# Patient Record
Sex: Male | Born: 1960 | Race: White | Hispanic: No | Marital: Married | Smoking: Current some day smoker
Health system: Southern US, Community
[De-identification: ages and names within clinical notes are randomized; demographics above are authoritative.]

## PROBLEM LIST (undated history)

## (undated) DIAGNOSIS — R112 Nausea with vomiting, unspecified: Secondary | ICD-10-CM

## (undated) DIAGNOSIS — I10 Essential (primary) hypertension: Secondary | ICD-10-CM

## (undated) DIAGNOSIS — Z889 Allergy status to unspecified drugs, medicaments and biological substances status: Secondary | ICD-10-CM

## (undated) DIAGNOSIS — Z9889 Other specified postprocedural states: Secondary | ICD-10-CM

## (undated) DIAGNOSIS — I82409 Acute embolism and thrombosis of unspecified deep veins of unspecified lower extremity: Secondary | ICD-10-CM

## (undated) DIAGNOSIS — G4730 Sleep apnea, unspecified: Secondary | ICD-10-CM

## (undated) DIAGNOSIS — E669 Obesity, unspecified: Secondary | ICD-10-CM

## (undated) DIAGNOSIS — K219 Gastro-esophageal reflux disease without esophagitis: Secondary | ICD-10-CM

## (undated) DIAGNOSIS — E119 Type 2 diabetes mellitus without complications: Secondary | ICD-10-CM

## (undated) DIAGNOSIS — J40 Bronchitis, not specified as acute or chronic: Secondary | ICD-10-CM

## (undated) DIAGNOSIS — M1990 Unspecified osteoarthritis, unspecified site: Secondary | ICD-10-CM

## (undated) DIAGNOSIS — R7303 Prediabetes: Secondary | ICD-10-CM

## (undated) HISTORY — PX: COLONOSCOPY W/ BIOPSIES AND POLYPECTOMY: SHX1376

## (undated) HISTORY — PX: FRACTURE SURGERY: SHX138

## (undated) HISTORY — PX: MULTIPLE TOOTH EXTRACTIONS: SHX2053

---

## 2005-10-23 ENCOUNTER — Ambulatory Visit: Payer: Self-pay | Admitting: Family Medicine

## 2006-08-30 ENCOUNTER — Other Ambulatory Visit: Payer: Self-pay

## 2006-08-30 ENCOUNTER — Emergency Department: Payer: Self-pay | Admitting: Emergency Medicine

## 2006-10-16 ENCOUNTER — Ambulatory Visit: Payer: Self-pay | Admitting: Gastroenterology

## 2006-10-22 ENCOUNTER — Ambulatory Visit: Payer: Self-pay | Admitting: Gastroenterology

## 2009-08-29 ENCOUNTER — Ambulatory Visit: Payer: Self-pay | Admitting: Unknown Physician Specialty

## 2009-09-10 ENCOUNTER — Encounter: Payer: Self-pay | Admitting: Physician Assistant

## 2009-09-28 ENCOUNTER — Encounter: Payer: Self-pay | Admitting: Physician Assistant

## 2010-04-03 ENCOUNTER — Emergency Department: Payer: Self-pay | Admitting: Emergency Medicine

## 2010-07-10 ENCOUNTER — Ambulatory Visit: Payer: Self-pay | Admitting: Otolaryngology

## 2010-07-22 ENCOUNTER — Ambulatory Visit: Payer: Self-pay | Admitting: Family Medicine

## 2010-08-03 ENCOUNTER — Other Ambulatory Visit: Payer: Self-pay | Admitting: Family Medicine

## 2011-04-22 ENCOUNTER — Ambulatory Visit: Payer: Self-pay | Admitting: Family Medicine

## 2011-06-25 ENCOUNTER — Ambulatory Visit: Payer: Self-pay | Admitting: Unknown Physician Specialty

## 2011-11-11 ENCOUNTER — Ambulatory Visit: Payer: Self-pay | Admitting: Oncology

## 2011-11-21 ENCOUNTER — Ambulatory Visit: Payer: Self-pay | Admitting: Oncology

## 2011-11-29 ENCOUNTER — Ambulatory Visit: Payer: Self-pay | Admitting: Oncology

## 2012-06-30 HISTORY — PX: BACK SURGERY: SHX140

## 2013-10-03 DIAGNOSIS — M1710 Unilateral primary osteoarthritis, unspecified knee: Secondary | ICD-10-CM | POA: Insufficient documentation

## 2013-10-03 DIAGNOSIS — I10 Essential (primary) hypertension: Secondary | ICD-10-CM | POA: Insufficient documentation

## 2013-10-03 DIAGNOSIS — Z87898 Personal history of other specified conditions: Secondary | ICD-10-CM | POA: Insufficient documentation

## 2013-10-03 DIAGNOSIS — IMO0002 Reserved for concepts with insufficient information to code with codable children: Secondary | ICD-10-CM | POA: Insufficient documentation

## 2013-10-03 DIAGNOSIS — I82409 Acute embolism and thrombosis of unspecified deep veins of unspecified lower extremity: Secondary | ICD-10-CM | POA: Insufficient documentation

## 2013-10-03 DIAGNOSIS — J309 Allergic rhinitis, unspecified: Secondary | ICD-10-CM | POA: Insufficient documentation

## 2013-10-07 ENCOUNTER — Emergency Department: Payer: Self-pay | Admitting: Emergency Medicine

## 2013-10-07 LAB — CBC WITH DIFFERENTIAL/PLATELET
BASOS ABS: 0 10*3/uL (ref 0.0–0.1)
BASOS PCT: 0.4 %
Eosinophil #: 0.1 10*3/uL (ref 0.0–0.7)
Eosinophil %: 0.7 %
HCT: 40.7 % (ref 40.0–52.0)
HGB: 13.3 g/dL (ref 13.0–18.0)
Lymphocyte #: 0.8 10*3/uL — ABNORMAL LOW (ref 1.0–3.6)
Lymphocyte %: 8.1 %
MCH: 28.9 pg (ref 26.0–34.0)
MCHC: 32.7 g/dL (ref 32.0–36.0)
MCV: 88 fL (ref 80–100)
MONO ABS: 0.6 x10 3/mm (ref 0.2–1.0)
MONOS PCT: 6.4 %
NEUTROS ABS: 8 10*3/uL — AB (ref 1.4–6.5)
Neutrophil %: 84.4 %
Platelet: 240 10*3/uL (ref 150–440)
RBC: 4.61 10*6/uL (ref 4.40–5.90)
RDW: 13.7 % (ref 11.5–14.5)
WBC: 9.5 10*3/uL (ref 3.8–10.6)

## 2013-10-07 LAB — COMPREHENSIVE METABOLIC PANEL
ANION GAP: 6 — AB (ref 7–16)
Albumin: 3.4 g/dL (ref 3.4–5.0)
Alkaline Phosphatase: 103 U/L
BILIRUBIN TOTAL: 0.6 mg/dL (ref 0.2–1.0)
BUN: 18 mg/dL (ref 7–18)
CHLORIDE: 101 mmol/L (ref 98–107)
Calcium, Total: 8.8 mg/dL (ref 8.5–10.1)
Co2: 27 mmol/L (ref 21–32)
Creatinine: 1.05 mg/dL (ref 0.60–1.30)
EGFR (African American): >60
EGFR (Non-African Amer.): >60
Glucose: 138 mg/dL — ABNORMAL HIGH (ref 65–99)
Osmolality: 272 (ref 275–301)
POTASSIUM: 3.9 mmol/L (ref 3.5–5.1)
SGOT(AST): 26 U/L (ref 15–37)
SGPT (ALT): 32 U/L (ref 12–78)
Sodium: 134 mmol/L — ABNORMAL LOW (ref 136–145)
TOTAL PROTEIN: 7.5 g/dL (ref 6.4–8.2)

## 2013-10-07 LAB — PROTIME-INR
INR: 1.4
Prothrombin Time: 16.7 secs — ABNORMAL HIGH (ref 11.5–14.7)

## 2014-05-19 DIAGNOSIS — E119 Type 2 diabetes mellitus without complications: Secondary | ICD-10-CM | POA: Insufficient documentation

## 2014-05-19 DIAGNOSIS — E781 Pure hyperglyceridemia: Secondary | ICD-10-CM | POA: Insufficient documentation

## 2014-06-13 ENCOUNTER — Ambulatory Visit: Payer: Self-pay | Admitting: Gastroenterology

## 2014-07-04 ENCOUNTER — Ambulatory Visit: Payer: Self-pay | Admitting: Gastroenterology

## 2014-07-05 ENCOUNTER — Emergency Department: Payer: Self-pay | Admitting: Emergency Medicine

## 2014-07-05 LAB — CBC
HCT: 46 % (ref 40.0–52.0)
HGB: 15.4 g/dL (ref 13.0–18.0)
MCH: 30.6 pg (ref 26.0–34.0)
MCHC: 33.6 g/dL (ref 32.0–36.0)
MCV: 91 fL (ref 80–100)
Platelet: 184 10*3/uL (ref 150–440)
RBC: 5.04 10*6/uL (ref 4.40–5.90)
RDW: 13.4 % (ref 11.5–14.5)
WBC: 9.3 10*3/uL (ref 3.8–10.6)

## 2014-07-05 LAB — COMPREHENSIVE METABOLIC PANEL
ALBUMIN: 4 g/dL (ref 3.4–5.0)
ALT: 58 U/L
AST: 44 U/L — AB (ref 15–37)
Alkaline Phosphatase: 90 U/L
Anion Gap: 10 (ref 7–16)
BUN: 16 mg/dL (ref 7–18)
Bilirubin,Total: 0.4 mg/dL (ref 0.2–1.0)
Calcium, Total: 9.3 mg/dL (ref 8.5–10.1)
Chloride: 103 mmol/L (ref 98–107)
Co2: 25 mmol/L (ref 21–32)
Creatinine: 1.1 mg/dL (ref 0.60–1.30)
EGFR (African American): >60
EGFR (Non-African Amer.): >60
GLUCOSE: 191 mg/dL — AB (ref 65–99)
Osmolality: 282 (ref 275–301)
Potassium: 3.4 mmol/L — ABNORMAL LOW (ref 3.5–5.1)
Sodium: 138 mmol/L (ref 136–145)
TOTAL PROTEIN: 8.3 g/dL — AB (ref 6.4–8.2)

## 2014-07-05 LAB — URINALYSIS, COMPLETE
Bacteria: NONE SEEN
Bilirubin,UR: NEGATIVE
Blood: NEGATIVE
Glucose,UR: 50 mg/dL (ref 0–75)
Hyaline Cast: 3
Leukocyte Esterase: NEGATIVE
Nitrite: NEGATIVE
PH: 5 (ref 4.5–8.0)
RBC,UR: <1 /HPF (ref 0–5)
SPECIFIC GRAVITY: 1.025 (ref 1.003–1.030)
Squamous Epithelial: NONE SEEN
WBC UR: 1 /HPF (ref 0–5)

## 2014-07-05 LAB — TROPONIN I: Troponin-I: <0.02 ng/mL

## 2014-07-05 LAB — LIPASE, BLOOD: Lipase: 192 U/L (ref 73–393)

## 2014-10-23 LAB — SURGICAL PATHOLOGY

## 2016-05-20 ENCOUNTER — Other Ambulatory Visit: Payer: Self-pay | Admitting: Orthopedic Surgery

## 2016-05-30 ENCOUNTER — Encounter (HOSPITAL_COMMUNITY)
Admission: RE | Admit: 2016-05-30 | Discharge: 2016-05-30 | Disposition: A | Discharge status: Home / Self Care | Payer: Managed Care, Other (non HMO) | Source: Ambulatory Visit | Attending: Orthopedic Surgery | Admitting: Orthopedic Surgery

## 2016-05-30 ENCOUNTER — Encounter (HOSPITAL_COMMUNITY): Payer: Self-pay

## 2016-05-30 DIAGNOSIS — Z01810 Encounter for preprocedural cardiovascular examination: Secondary | ICD-10-CM | POA: Diagnosis present

## 2016-05-30 DIAGNOSIS — K219 Gastro-esophageal reflux disease without esophagitis: Secondary | ICD-10-CM | POA: Diagnosis not present

## 2016-05-30 DIAGNOSIS — Z01812 Encounter for preprocedural laboratory examination: Secondary | ICD-10-CM | POA: Diagnosis not present

## 2016-05-30 DIAGNOSIS — E119 Type 2 diabetes mellitus without complications: Secondary | ICD-10-CM | POA: Diagnosis not present

## 2016-05-30 DIAGNOSIS — R001 Bradycardia, unspecified: Secondary | ICD-10-CM | POA: Diagnosis not present

## 2016-05-30 DIAGNOSIS — I10 Essential (primary) hypertension: Secondary | ICD-10-CM | POA: Diagnosis not present

## 2016-05-30 DIAGNOSIS — R9431 Abnormal electrocardiogram [ECG] [EKG]: Secondary | ICD-10-CM | POA: Insufficient documentation

## 2016-05-30 DIAGNOSIS — M1990 Unspecified osteoarthritis, unspecified site: Secondary | ICD-10-CM | POA: Insufficient documentation

## 2016-05-30 DIAGNOSIS — G4730 Sleep apnea, unspecified: Secondary | ICD-10-CM | POA: Diagnosis not present

## 2016-05-30 DIAGNOSIS — E669 Obesity, unspecified: Secondary | ICD-10-CM | POA: Insufficient documentation

## 2016-05-30 DIAGNOSIS — Z86718 Personal history of other venous thrombosis and embolism: Secondary | ICD-10-CM | POA: Diagnosis not present

## 2016-05-30 HISTORY — DX: Gastro-esophageal reflux disease without esophagitis: K21.9

## 2016-05-30 HISTORY — DX: Unspecified osteoarthritis, unspecified site: M19.90

## 2016-05-30 HISTORY — DX: Other specified postprocedural states: Z98.890

## 2016-05-30 HISTORY — DX: Acute embolism and thrombosis of unspecified deep veins of unspecified lower extremity: I82.409

## 2016-05-30 HISTORY — DX: Allergy status to unspecified drugs, medicaments and biological substances: Z88.9

## 2016-05-30 HISTORY — DX: Bronchitis, not specified as acute or chronic: J40

## 2016-05-30 HISTORY — DX: Sleep apnea, unspecified: G47.30

## 2016-05-30 HISTORY — DX: Obesity, unspecified: E66.9

## 2016-05-30 HISTORY — DX: Prediabetes: R73.03

## 2016-05-30 HISTORY — DX: Essential (primary) hypertension: I10

## 2016-05-30 HISTORY — DX: Nausea with vomiting, unspecified: R11.2

## 2016-05-30 LAB — CBC WITH DIFFERENTIAL/PLATELET
BASOS PCT: 0 %
Basophils Absolute: 0 10*3/uL (ref 0.0–0.1)
Eosinophils Absolute: 0.1 10*3/uL (ref 0.0–0.7)
Eosinophils Relative: 2 %
HEMATOCRIT: 45.2 % (ref 39.0–52.0)
Hemoglobin: 15.5 g/dL (ref 13.0–17.0)
LYMPHS ABS: 1.9 10*3/uL (ref 0.7–4.0)
Lymphocytes Relative: 29 %
MCH: 30.9 pg (ref 26.0–34.0)
MCHC: 34.3 g/dL (ref 30.0–36.0)
MCV: 90 fL (ref 78.0–100.0)
MONO ABS: 0.4 10*3/uL (ref 0.1–1.0)
MONOS PCT: 6 %
NEUTROS ABS: 4.2 10*3/uL (ref 1.7–7.7)
Neutrophils Relative %: 63 %
Platelets: 158 10*3/uL (ref 150–400)
RBC: 5.02 MIL/uL (ref 4.22–5.81)
RDW: 13.4 % (ref 11.5–15.5)
WBC: 6.6 10*3/uL (ref 4.0–10.5)

## 2016-05-30 LAB — URINALYSIS, ROUTINE W REFLEX MICROSCOPIC
BILIRUBIN URINE: NEGATIVE
GLUCOSE, UA: NEGATIVE mg/dL
HGB URINE DIPSTICK: NEGATIVE
KETONES UR: NEGATIVE mg/dL
Leukocytes, UA: NEGATIVE
Nitrite: NEGATIVE
PH: 6.5 (ref 5.0–8.0)
Protein, ur: NEGATIVE mg/dL
SPECIFIC GRAVITY, URINE: 1.019 (ref 1.005–1.030)

## 2016-05-30 LAB — BASIC METABOLIC PANEL
ANION GAP: 10 (ref 5–15)
BUN: 20 mg/dL (ref 6–20)
CALCIUM: 9.8 mg/dL (ref 8.9–10.3)
CO2: 24 mmol/L (ref 22–32)
CREATININE: 0.97 mg/dL (ref 0.61–1.24)
Chloride: 106 mmol/L (ref 101–111)
GFR calc Af Amer: >60 mL/min (ref >60)
GFR calc non Af Amer: >60 mL/min (ref >60)
GLUCOSE: 91 mg/dL (ref 65–99)
Potassium: 4 mmol/L (ref 3.5–5.1)
Sodium: 140 mmol/L (ref 135–145)

## 2016-05-30 LAB — TYPE AND SCREEN
ABO/RH(D): B POS
ANTIBODY SCREEN: NEGATIVE

## 2016-05-30 LAB — SURGICAL PCR SCREEN
MRSA, PCR: NEGATIVE
STAPHYLOCOCCUS AUREUS: NEGATIVE

## 2016-05-30 LAB — PROTIME-INR
INR: 0.96
Prothrombin Time: 12.8 seconds (ref 11.4–15.2)

## 2016-05-30 LAB — APTT: aPTT: 31 seconds (ref 24–36)

## 2016-05-30 LAB — GLUCOSE, CAPILLARY: Glucose-Capillary: 99 mg/dL (ref 65–99)

## 2016-05-30 LAB — ABO/RH: ABO/RH(D): B POS

## 2016-05-30 NOTE — Pre-Procedure Instructions (Signed)
AUTHOR SLAVEN  05/30/2016      [id number] [address], [place] AT [address] [place] [address] Phone: [phone] Fax: [phone]    Your procedure is scheduled on Monday, June 09, 2016  Report to [facility] Admitting at 8:15 A.M.  Call this number if you have problems the morning of surgery:  [phone]   Remember:  Do not eat food or drink liquids after midnight Sunday, June 08, 2016  Take these medicines the morning of surgery with A SIP OF WATER : amLODipine (NORVASC), fexofenadine (ALLEGRA), omeprazole (PRILOSEC)  Stop taking Aspirin, vitamins, fish oil, Megared Omega 3 Krill oil and herbal medications. Do not take any NSAIDs ie: Ibuprofen, Advil, Naproxen, BC and Goody Powder or any medication containing Aspirin such as nabumetone (RELAFEN); stop Monday, June 02, 2016    How to Manage Your Diabetes Before and After Surgery  Why is it important to control my blood sugar before and after surgery? . Improving blood sugar levels before and after surgery helps healing and can limit problems. . A way of improving blood sugar control is eating a healthy diet by: o  Eating less sugar and carbohydrates o  Increasing activity/exercise o  Talking with your doctor about reaching your blood sugar goals . High blood sugars (greater than 180 mg/dL) can raise your risk of infections and slow your recovery, so you will need to focus on controlling your diabetes during the weeks before surgery. . Make sure that the doctor who takes care of your diabetes knows about your planned surgery including the date and location.  How do I manage my blood sugar before surgery? . Check your blood sugar at least 4 times a day, starting 2 days before surgery, to make sure that the level is not too high or low. o Check your blood sugar the morning of your surgery when you wake up and every 2 hours  until you get to the Short Stay unit. . If your blood sugar is less than 70 mg/dL, you will need to treat for low blood sugar: o Do not take insulin. o Treat a low blood sugar (less than 70 mg/dL) with  cup of clear juice (cranberry or apple), 4 glucose tablets, OR glucose gel. o Recheck blood sugar in 15 minutes after treatment (to make sure it is greater than 70 mg/dL). If your blood sugar is not greater than 70 mg/dL on recheck, call [phone] for further instructions. . Report your blood sugar to the short stay nurse when you get to Short Stay.  . If you are admitted to the hospital after surgery: o Your blood sugar will be checked by the staff and you will probably be given insulin after surgery (instead of oral diabetes medicines) to make sure you have good blood sugar levels. o The goal for blood sugar control after surgery is 80-180 mg/dL.  WHAT DO I DO ABOUT MY DIABETES MEDICATION?  Marland Kitchen Do not take oral diabetes medicines (pills) the morning of surgery such as metFORMIN (GLUCOPHAGE-XR)   Patient Signature:  Date:   Nurse Signature:  Date:   Reviewed and Endorsed by [facility] Patient Education Committee, August 2015  Do not wear jewelry, make-up or nail polish.  Do not wear lotions, powders, or perfumes, or deoderant.  Do not shave 48 hours prior to surgery.  Men may shave face and neck.  Do not bring valuables to the hospital.  [facility] is not responsible for any belongings or valuables.  Contacts, dentures or bridgework may not be worn into surgery.  Leave your suitcase in the car.  After surgery it may be brought to your room.  For patients admitted to the hospital, discharge time will be determined by your treatment team.  Special instructions: Shower the night before surgery and the morning of surgery with CHG.  Please read over the following fact sheets that you were given. Pain Booklet, Coughing and Deep Breathing, Blood Transfusion Information, Total Joint  Packet, MRSA Information and Surgical Site Infection Prevention

## 2016-05-30 NOTE — Progress Notes (Signed)
Pt denies SOB, chest pain, and being under the care of a cardiologist. Pt stated that a stress test was performed > 10 years ago but denies having a cardiac cath and echo. Pt denies having any recent labs and last A1c was done in June 2017. Requested recent chest x ray results from [facility] in [place].

## 2016-05-31 LAB — HEMOGLOBIN A1C
Hgb A1c MFr Bld: 5.6 % (ref 4.8–5.6)
Mean Plasma Glucose: 114 mg/dL

## 2016-06-05 NOTE — H&P (Signed)
TOTAL HIP ADMISSION H&P  Patient is admitted for right total hip arthroplasty.  Subjective:  Chief Complaint: right hip pain  HPI: Connor Brewer, 55 y.o. male, has a history of pain and functional disability in the right hip(s) due to arthritis and patient has failed non-surgical conservative treatments for greater than 12 weeks to include NSAID's and/or analgesics, corticosteriod injections, flexibility and strengthening excercises, weight reduction as appropriate and activity modification.  Onset of symptoms was abrupt starting 1 years ago with rapidlly worsening course since that time.The patient noted no past surgery on the right hip(s).  Patient currently rates pain in the right hip at 10 out of 10 with activity. Patient has night pain, worsening of pain with activity and weight bearing, trendelenberg gait, pain that interfers with activities of daily living and pain with passive range of motion. Patient has evidence of joint space narrowing by imaging studies. This condition presents safety issues increasing the risk of falls.  There is no current active infection.  There are no active problems to display for this patient.  Past Medical History:  Diagnosis Date  . Bronchitis    " aalergy driven"  . DVT (deep venous thrombosis) (HCC)    LLE  . GERD (gastroesophageal reflux disease)   . H/O seasonal allergies   . Hypertension   . Obesity   . Osteoarthritis    right hip  . PONV (postoperative nausea and vomiting)   . Pre-diabetes   . Sleep apnea    wears CPAP    Past Surgical History:  Procedure Laterality Date  . BACK SURGERY     laser spine  . COLONOSCOPY W/ BIOPSIES AND POLYPECTOMY    . FRACTURE SURGERY     right arm, left knee, right knee  . MULTIPLE TOOTH EXTRACTIONS      No prescriptions prior to admission.   Allergies  Allergen Reactions  . Ivp Dye [Iodinated Diagnostic Agents] Anaphylaxis and Other (See Comments)    Reaction took place in 1983.    Social History   Substance Use Topics  . Smoking status: Current Some Day Smoker    Types: Cigars  . Smokeless tobacco: Never Used  . Alcohol use Yes     Comment: social    Family History  Problem Relation Age of Onset  . Lung cancer Father      Review of Systems  Constitutional: Negative.   HENT:       Sinus problems  Eyes: Negative.   Respiratory: Negative.   Cardiovascular: Negative.   Gastrointestinal: Negative.   Genitourinary: Negative.   Musculoskeletal: Positive for joint pain.  Skin: Negative.   Neurological: Negative.   Endo/Heme/Allergies:       Hx of blood clots and blood sugar problem  Psychiatric/Behavioral: Negative.     Objective:  Physical Exam  Constitutional: He is oriented to person, place, and time. He appears well-developed and well-nourished.  HENT:  Head: Normocephalic and atraumatic.  Eyes: Pupils are equal, round, and reactive to light.  Neck: Normal range of motion. Neck supple.  Cardiovascular: Intact distal pulses.   Respiratory: Effort normal.  Musculoskeletal: He exhibits tenderness.  he does have obvious discomfort with internal rotation of the right hip.  Minimal pain with straight leg raise.  He does have severely limited internal rotation to approximately 5 to maybe 10.  His calves are soft and nontender.  He is neurovascularly intact distally.  Neurological: He is alert and oriented to person, place, and time.  Skin: Skin  is warm and dry.  Psychiatric: He has a normal mood and affect. His behavior is normal. Judgment and thought content normal.    Vital signs in last 24 hours:    Labs:   There is no height or weight on file to calculate BMI.   Imaging Review Plain radiographs demonstrate  rapidly progressive arthritis of the right hip with obvious reduction of the joint space to near bone-on-bone.  Assessment/Plan:  End stage arthritis, right hip(s)  The patient history, physical examination, clinical judgement of the provider and  imaging studies are consistent with end stage degenerative joint disease of the right hip(s) and total hip arthroplasty is deemed medically necessary. The treatment options including medical management, injection therapy, arthroscopy and arthroplasty were discussed at length. The risks and benefits of total hip arthroplasty were presented and reviewed. The risks due to aseptic loosening, infection, stiffness, dislocation/subluxation,  thromboembolic complications and other imponderables were discussed.  The patient acknowledged the explanation, agreed to proceed with the plan and consent was signed. Patient is being admitted for inpatient treatment for surgery, pain control, PT, OT, prophylactic antibiotics, VTE prophylaxis, progressive ambulation and ADL's and discharge planning.The patient is planning to be discharged home with home health services

## 2016-06-06 DIAGNOSIS — M1611 Unilateral primary osteoarthritis, right hip: Secondary | ICD-10-CM | POA: Diagnosis present

## 2016-06-06 MED ORDER — DEXTROSE 5 % IV SOLN
3.0000 g | INTRAVENOUS | Status: AC
  Administered 2016-06-09: 3 g via INTRAVENOUS
  Filled 2016-06-06: qty 3000

## 2016-06-06 MED ORDER — DEXTROSE-NACL 5-0.45 % IV SOLN: INTRAVENOUS | Status: DC

## 2016-06-09 ENCOUNTER — Inpatient Hospital Stay (HOSPITAL_COMMUNITY): Payer: Managed Care, Other (non HMO) | Admitting: Certified Registered Nurse Anesthetist

## 2016-06-09 ENCOUNTER — Inpatient Hospital Stay (HOSPITAL_COMMUNITY): Payer: Managed Care, Other (non HMO)

## 2016-06-09 ENCOUNTER — Inpatient Hospital Stay (HOSPITAL_COMMUNITY)
Admission: RE | Admit: 2016-06-09 | Discharge: 2016-06-11 | DRG: 470 | Disposition: A | Discharge status: Home Health | Payer: Managed Care, Other (non HMO) | Source: Ambulatory Visit | Attending: Orthopedic Surgery | Admitting: Orthopedic Surgery

## 2016-06-09 ENCOUNTER — Inpatient Hospital Stay (HOSPITAL_COMMUNITY): Payer: Managed Care, Other (non HMO) | Admitting: Vascular Surgery

## 2016-06-09 ENCOUNTER — Encounter (HOSPITAL_COMMUNITY)
Admission: RE | Disposition: A | Discharge status: Home Health | Payer: Self-pay | Source: Ambulatory Visit | Attending: Orthopedic Surgery

## 2016-06-09 DIAGNOSIS — E119 Type 2 diabetes mellitus without complications: Secondary | ICD-10-CM | POA: Diagnosis present

## 2016-06-09 DIAGNOSIS — F17200 Nicotine dependence, unspecified, uncomplicated: Secondary | ICD-10-CM | POA: Diagnosis present

## 2016-06-09 DIAGNOSIS — Z419 Encounter for procedure for purposes other than remedying health state, unspecified: Secondary | ICD-10-CM

## 2016-06-09 DIAGNOSIS — G4730 Sleep apnea, unspecified: Secondary | ICD-10-CM | POA: Diagnosis present

## 2016-06-09 DIAGNOSIS — M25551 Pain in right hip: Secondary | ICD-10-CM | POA: Diagnosis present

## 2016-06-09 DIAGNOSIS — Z86718 Personal history of other venous thrombosis and embolism: Secondary | ICD-10-CM | POA: Diagnosis not present

## 2016-06-09 DIAGNOSIS — Z7984 Long term (current) use of oral hypoglycemic drugs: Secondary | ICD-10-CM | POA: Diagnosis not present

## 2016-06-09 DIAGNOSIS — Z91041 Radiographic dye allergy status: Secondary | ICD-10-CM | POA: Diagnosis not present

## 2016-06-09 DIAGNOSIS — M1611 Unilateral primary osteoarthritis, right hip: Principal | ICD-10-CM | POA: Diagnosis present

## 2016-06-09 DIAGNOSIS — I10 Essential (primary) hypertension: Secondary | ICD-10-CM | POA: Diagnosis present

## 2016-06-09 DIAGNOSIS — K219 Gastro-esophageal reflux disease without esophagitis: Secondary | ICD-10-CM | POA: Diagnosis present

## 2016-06-09 HISTORY — PX: TOTAL HIP ARTHROPLASTY: SHX124

## 2016-06-09 LAB — GLUCOSE, CAPILLARY
Glucose-Capillary: 94 mg/dL (ref 65–99)
Glucose-Capillary: 176 mg/dL — ABNORMAL HIGH (ref 65–99)

## 2016-06-09 SURGERY — ARTHROPLASTY, HIP, TOTAL, ANTERIOR APPROACH
Anesthesia: Spinal | Laterality: Right

## 2016-06-09 MED ORDER — METOCLOPRAMIDE HCL 5 MG/ML IJ SOLN: 5.0000 mg | Freq: Three times a day (TID) | INTRAMUSCULAR | Status: DC | PRN

## 2016-06-09 MED ORDER — MIDAZOLAM HCL 2 MG/2ML IJ SOLN
INTRAMUSCULAR | Status: AC
  Filled 2016-06-09: qty 2

## 2016-06-09 MED ORDER — ACETAMINOPHEN 325 MG PO TABS
650.0000 mg | ORAL_TABLET | Freq: Four times a day (QID) | ORAL | Status: DC | PRN
  Administered 2016-06-10 (×2): 650 mg via ORAL
  Filled 2016-06-09 (×2): qty 2

## 2016-06-09 MED ORDER — KCL IN DEXTROSE-NACL 20-5-0.45 MEQ/L-%-% IV SOLN
INTRAVENOUS | Status: DC
  Administered 2016-06-09 (×2): via INTRAVENOUS
  Filled 2016-06-09 (×3): qty 1000

## 2016-06-09 MED ORDER — METFORMIN HCL ER 500 MG PO TB24
750.0000 mg | ORAL_TABLET | Freq: Every day | ORAL | Status: DC
  Administered 2016-06-11: 750 mg via ORAL
  Filled 2016-06-09: qty 1.5

## 2016-06-09 MED ORDER — ASPIRIN EC 325 MG PO TBEC: 325.0000 mg | DELAYED_RELEASE_TABLET | Freq: Two times a day (BID) | ORAL | 0 refills | Status: DC

## 2016-06-09 MED ORDER — LORATADINE 10 MG PO TABS
10.0000 mg | ORAL_TABLET | Freq: Every day | ORAL | Status: DC
  Administered 2016-06-10 – 2016-06-11 (×2): 10 mg via ORAL
  Filled 2016-06-09 (×2): qty 1

## 2016-06-09 MED ORDER — FLEET ENEMA 7-19 GM/118ML RE ENEM: 1.0000 | ENEMA | Freq: Once | RECTAL | Status: DC | PRN

## 2016-06-09 MED ORDER — HYDROMORPHONE HCL 1 MG/ML IJ SOLN: 0.2500 mg | INTRAMUSCULAR | Status: DC | PRN

## 2016-06-09 MED ORDER — METHOCARBAMOL 500 MG PO TABS
500.0000 mg | ORAL_TABLET | Freq: Four times a day (QID) | ORAL | Status: DC | PRN
  Administered 2016-06-09 – 2016-06-11 (×5): 500 mg via ORAL
  Filled 2016-06-09 (×6): qty 1

## 2016-06-09 MED ORDER — MIDAZOLAM HCL 2 MG/2ML IJ SOLN
INTRAMUSCULAR | Status: DC | PRN
  Administered 2016-06-09: 2 mg via INTRAVENOUS

## 2016-06-09 MED ORDER — ACETAMINOPHEN 650 MG RE SUPP: 650.0000 mg | Freq: Four times a day (QID) | RECTAL | Status: DC | PRN

## 2016-06-09 MED ORDER — LOSARTAN POTASSIUM 50 MG PO TABS
50.0000 mg | ORAL_TABLET | Freq: Every day | ORAL | Status: DC
  Administered 2016-06-10 – 2016-06-11 (×2): 50 mg via ORAL
  Filled 2016-06-09 (×2): qty 1

## 2016-06-09 MED ORDER — METHOCARBAMOL 500 MG PO TABS: 500.0000 mg | ORAL_TABLET | Freq: Two times a day (BID) | ORAL | 0 refills | Status: DC

## 2016-06-09 MED ORDER — PHENOL 1.4 % MT LIQD: 1.0000 | OROMUCOSAL | Status: DC | PRN

## 2016-06-09 MED ORDER — DIPHENHYDRAMINE HCL 12.5 MG/5ML PO ELIX: 12.5000 mg | ORAL_SOLUTION | ORAL | Status: DC | PRN

## 2016-06-09 MED ORDER — OXYCODONE HCL 5 MG PO TABS
5.0000 mg | ORAL_TABLET | ORAL | Status: DC | PRN
  Administered 2016-06-09 – 2016-06-11 (×11): 10 mg via ORAL
  Filled 2016-06-09 (×13): qty 2

## 2016-06-09 MED ORDER — PANTOPRAZOLE SODIUM 40 MG PO TBEC
40.0000 mg | DELAYED_RELEASE_TABLET | Freq: Every day | ORAL | Status: DC
  Administered 2016-06-09 – 2016-06-11 (×3): 40 mg via ORAL
  Filled 2016-06-09 (×3): qty 1

## 2016-06-09 MED ORDER — 0.9 % SODIUM CHLORIDE (POUR BTL) OPTIME
TOPICAL | Status: DC | PRN
  Administered 2016-06-09: 1000 mL

## 2016-06-09 MED ORDER — HYDROCHLOROTHIAZIDE 12.5 MG PO CAPS
12.5000 mg | ORAL_CAPSULE | Freq: Every day | ORAL | Status: DC
  Administered 2016-06-10 – 2016-06-11 (×2): 12.5 mg via ORAL
  Filled 2016-06-09 (×2): qty 1

## 2016-06-09 MED ORDER — ONDANSETRON HCL 4 MG PO TABS: 4.0000 mg | ORAL_TABLET | Freq: Four times a day (QID) | ORAL | Status: DC | PRN

## 2016-06-09 MED ORDER — LOSARTAN POTASSIUM-HCTZ 50-12.5 MG PO TABS: 1.0000 | ORAL_TABLET | Freq: Two times a day (BID) | ORAL | Status: DC

## 2016-06-09 MED ORDER — BUPIVACAINE LIPOSOME 1.3 % IJ SUSP
20.0000 mL | Freq: Once | INTRAMUSCULAR | Status: AC
  Administered 2016-06-09: 20 mL
  Filled 2016-06-09: qty 20

## 2016-06-09 MED ORDER — PHENYLEPHRINE HCL 10 MG/ML IJ SOLN
INTRAVENOUS | Status: DC | PRN
  Administered 2016-06-09: 20 ug/min via INTRAVENOUS

## 2016-06-09 MED ORDER — BISACODYL 5 MG PO TBEC: 5.0000 mg | DELAYED_RELEASE_TABLET | Freq: Every day | ORAL | Status: DC | PRN

## 2016-06-09 MED ORDER — METOCLOPRAMIDE HCL 5 MG PO TABS: 5.0000 mg | ORAL_TABLET | Freq: Three times a day (TID) | ORAL | Status: DC | PRN

## 2016-06-09 MED ORDER — PROPOFOL 10 MG/ML IV BOLUS
INTRAVENOUS | Status: DC | PRN
  Administered 2016-06-09: 30 mg via INTRAVENOUS
  Administered 2016-06-09 (×2): 20 mg via INTRAVENOUS

## 2016-06-09 MED ORDER — TRANEXAMIC ACID 1000 MG/10ML IV SOLN
2000.0000 mg | Freq: Once | INTRAVENOUS | Status: AC
  Administered 2016-06-09: 2000 mg via TOPICAL
  Filled 2016-06-09: qty 20

## 2016-06-09 MED ORDER — LACTATED RINGERS IV SOLN
INTRAVENOUS | Status: DC | PRN
  Administered 2016-06-09 (×2): via INTRAVENOUS

## 2016-06-09 MED ORDER — DEXAMETHASONE SODIUM PHOSPHATE 10 MG/ML IJ SOLN
10.0000 mg | Freq: Once | INTRAMUSCULAR | Status: AC
  Administered 2016-06-10: 10 mg via INTRAVENOUS
  Filled 2016-06-09: qty 1

## 2016-06-09 MED ORDER — BUPIVACAINE-EPINEPHRINE 0.25% -1:200000 IJ SOLN
INTRAMUSCULAR | Status: DC | PRN
  Administered 2016-06-09: 50 mL

## 2016-06-09 MED ORDER — LACTATED RINGERS IV SOLN
INTRAVENOUS | Status: DC
  Administered 2016-06-09: 50 mL/h via INTRAVENOUS

## 2016-06-09 MED ORDER — HYDROMORPHONE HCL 2 MG/ML IJ SOLN
0.5000 mg | INTRAMUSCULAR | Status: DC | PRN
  Administered 2016-06-09 – 2016-06-10 (×3): 1 mg via INTRAVENOUS
  Filled 2016-06-09 (×3): qty 1

## 2016-06-09 MED ORDER — METHOCARBAMOL 1000 MG/10ML IJ SOLN
500.0000 mg | Freq: Four times a day (QID) | INTRAVENOUS | Status: DC | PRN
  Filled 2016-06-09: qty 5

## 2016-06-09 MED ORDER — OXYCODONE-ACETAMINOPHEN 5-325 MG PO TABS: 1.0000 | ORAL_TABLET | ORAL | 0 refills | Status: DC | PRN

## 2016-06-09 MED ORDER — POLYETHYLENE GLYCOL 3350 17 G PO PACK: 17.0000 g | PACK | Freq: Every day | ORAL | Status: DC | PRN

## 2016-06-09 MED ORDER — CHLORHEXIDINE GLUCONATE 4 % EX LIQD: 60.0000 mL | Freq: Once | CUTANEOUS | Status: DC

## 2016-06-09 MED ORDER — BUPIVACAINE-EPINEPHRINE (PF) 0.25% -1:200000 IJ SOLN
INTRAMUSCULAR | Status: AC
  Filled 2016-06-09: qty 60

## 2016-06-09 MED ORDER — PROPOFOL 500 MG/50ML IV EMUL
INTRAVENOUS | Status: DC | PRN
  Administered 2016-06-09: 100 ug/kg/min via INTRAVENOUS
  Administered 2016-06-09: 11:00:00 via INTRAVENOUS

## 2016-06-09 MED ORDER — MEPERIDINE HCL 25 MG/ML IJ SOLN: 6.2500 mg | INTRAMUSCULAR | Status: DC | PRN

## 2016-06-09 MED ORDER — ALUMINUM HYDROXIDE GEL 320 MG/5ML PO SUSP
15.0000 mL | ORAL | Status: DC | PRN
  Administered 2016-06-10: 30 mL via ORAL
  Filled 2016-06-09 (×3): qty 30

## 2016-06-09 MED ORDER — DOCUSATE SODIUM 100 MG PO CAPS
100.0000 mg | ORAL_CAPSULE | Freq: Two times a day (BID) | ORAL | Status: DC
  Administered 2016-06-09 – 2016-06-11 (×5): 100 mg via ORAL
  Filled 2016-06-09 (×5): qty 1

## 2016-06-09 MED ORDER — ONDANSETRON HCL 4 MG/2ML IJ SOLN
4.0000 mg | Freq: Four times a day (QID) | INTRAMUSCULAR | Status: DC | PRN
  Administered 2016-06-10: 4 mg via INTRAVENOUS
  Filled 2016-06-09: qty 2

## 2016-06-09 MED ORDER — INSULIN ASPART 100 UNIT/ML ~~LOC~~ SOLN
0.0000 [IU] | Freq: Three times a day (TID) | SUBCUTANEOUS | Status: DC
  Administered 2016-06-10: 3 [IU] via SUBCUTANEOUS
  Administered 2016-06-10 – 2016-06-11 (×3): 2 [IU] via SUBCUTANEOUS

## 2016-06-09 MED ORDER — AMLODIPINE BESYLATE 5 MG PO TABS
5.0000 mg | ORAL_TABLET | Freq: Every day | ORAL | Status: DC
  Administered 2016-06-09 – 2016-06-11 (×3): 5 mg via ORAL
  Filled 2016-06-09 (×3): qty 1

## 2016-06-09 MED ORDER — FENTANYL CITRATE (PF) 100 MCG/2ML IJ SOLN
INTRAMUSCULAR | Status: DC | PRN
  Administered 2016-06-09: 100 ug via INTRAVENOUS

## 2016-06-09 MED ORDER — FENTANYL CITRATE (PF) 100 MCG/2ML IJ SOLN
INTRAMUSCULAR | Status: AC
  Filled 2016-06-09: qty 2

## 2016-06-09 MED ORDER — ASPIRIN EC 325 MG PO TBEC
325.0000 mg | DELAYED_RELEASE_TABLET | Freq: Every day | ORAL | Status: DC
  Administered 2016-06-10 – 2016-06-11 (×2): 325 mg via ORAL
  Filled 2016-06-09 (×2): qty 1

## 2016-06-09 MED ORDER — MENTHOL 3 MG MT LOZG: 1.0000 | LOZENGE | OROMUCOSAL | Status: DC | PRN

## 2016-06-09 MED ORDER — ONDANSETRON HCL 4 MG/2ML IJ SOLN: 4.0000 mg | Freq: Once | INTRAMUSCULAR | Status: DC | PRN

## 2016-06-09 SURGICAL SUPPLY — 48 items
BAG DECANTER FOR FLEXI CONT (MISCELLANEOUS) ×3 IMPLANT
BLADE SAW SGTL 18X1.27X75 (BLADE) ×2 IMPLANT
BLADE SAW SGTL 18X1.27X75MM (BLADE) ×1
CAPT HIP TOTAL 2 ×2 IMPLANT
COVER PERINEAL POST (MISCELLANEOUS) ×3 IMPLANT
COVER SURGICAL LIGHT HANDLE (MISCELLANEOUS) ×3 IMPLANT
DRAPE C-ARM 42X72 X-RAY (DRAPES) ×3 IMPLANT
DRAPE STERI IOBAN 125X83 (DRAPES) ×3 IMPLANT
DRAPE U-SHAPE 47X51 STRL (DRAPES) ×6 IMPLANT
DRSG AQUACEL AG ADV 3.5X10 (GAUZE/BANDAGES/DRESSINGS) ×3 IMPLANT
DURAPREP 26ML APPLICATOR (WOUND CARE) ×3 IMPLANT
ELECT BLADE 4.0 EZ CLEAN MEGAD (MISCELLANEOUS) ×3
ELECT REM PT RETURN 9FT ADLT (ELECTROSURGICAL) ×3
ELECTRODE BLDE 4.0 EZ CLN MEGD (MISCELLANEOUS) ×1 IMPLANT
ELECTRODE REM PT RTRN 9FT ADLT (ELECTROSURGICAL) ×1 IMPLANT
FACESHIELD WRAPAROUND (MASK) ×6 IMPLANT
FACESHIELD WRAPAROUND OR TEAM (MASK) ×2 IMPLANT
GLOVE BIO SURGEON STRL SZ7.5 (GLOVE) ×3 IMPLANT
GLOVE BIO SURGEON STRL SZ8.5 (GLOVE) ×3 IMPLANT
GLOVE BIOGEL PI IND STRL 8 (GLOVE) ×1 IMPLANT
GLOVE BIOGEL PI IND STRL 9 (GLOVE) ×1 IMPLANT
GLOVE BIOGEL PI INDICATOR 8 (GLOVE) ×2
GLOVE BIOGEL PI INDICATOR 9 (GLOVE) ×2
GOWN STRL REUS W/ TWL LRG LVL3 (GOWN DISPOSABLE) ×1 IMPLANT
GOWN STRL REUS W/ TWL XL LVL3 (GOWN DISPOSABLE) ×2 IMPLANT
GOWN STRL REUS W/TWL LRG LVL3 (GOWN DISPOSABLE) ×3
GOWN STRL REUS W/TWL XL LVL3 (GOWN DISPOSABLE) ×6
KIT BASIN OR (CUSTOM PROCEDURE TRAY) ×3 IMPLANT
KIT ROOM TURNOVER OR (KITS) ×3 IMPLANT
MANIFOLD NEPTUNE II (INSTRUMENTS) ×3 IMPLANT
NDL 22X1.5 STRL (OR ONLY) (MISCELLANEOUS) ×2 IMPLANT
NEEDLE 22X1 1/2 OR ONLY (MISCELLANEOUS) ×4
NEEDLE 22X1.5 STRL (OR ONLY) (MISCELLANEOUS) ×2 IMPLANT
NS IRRIG 1000ML POUR BTL (IV SOLUTION) ×3 IMPLANT
PACK TOTAL JOINT (CUSTOM PROCEDURE TRAY) ×3 IMPLANT
PAD ARMBOARD 7.5X6 YLW CONV (MISCELLANEOUS) ×6 IMPLANT
SUT VIC AB 0 CT1 27 (SUTURE) ×3
SUT VIC AB 0 CT1 27XBRD ANBCTR (SUTURE) IMPLANT
SUT VIC AB 1 CTX 36 (SUTURE) ×3
SUT VIC AB 1 CTX36XBRD ANBCTR (SUTURE) ×1 IMPLANT
SUT VIC AB 2-0 CT1 27 (SUTURE) ×3
SUT VIC AB 2-0 CT1 TAPERPNT 27 (SUTURE) ×1 IMPLANT
SUT VIC AB 3-0 PS2 18 (SUTURE) ×3
SUT VIC AB 3-0 PS2 18XBRD (SUTURE) ×1 IMPLANT
SYR CONTROL 10ML LL (SYRINGE) ×6 IMPLANT
TOWEL OR 17X24 6PK STRL BLUE (TOWEL DISPOSABLE) ×3 IMPLANT
TOWEL OR 17X26 10 PK STRL BLUE (TOWEL DISPOSABLE) ×3 IMPLANT
TRAY CATH 16FR W/PLASTIC CATH (SET/KITS/TRAYS/PACK) IMPLANT

## 2016-06-09 NOTE — Evaluation (Signed)
Physical Therapy Evaluation Patient Details Name: Connor Brewer MRN: [id number] DOB: Apr 29, 1961 Today's Date: 06/09/2016   History of Present Illness  55 y.o. male admitted to [facility] on 06/09/16 for elective R direct anterior THA.  Pt with significant PMHx of Pre diabetes, obesity, HTN, DVT, back surgery, right arm, left knee and right knee fracture surgeries.    Clinical Impression  Pt is POD #0 and is moving well, min guard to min assist overall for mobility and transfers with short distance gait in room with RW.  Pt will likely progress well enough to go home with wife and HHPT f/u at discharge.   PT to follow acutely for deficits listed below.       Follow Up Recommendations Home health PT;Supervision for mobility/OOB    Equipment Recommendations  None recommended by PT;Other (comment) (already delivered to home)    Recommendations for Other Services    NA    Precautions / Restrictions Precautions Precautions: Fall Restrictions Weight Bearing Restrictions: Yes RLE Weight Bearing: Weight bearing as tolerated      Mobility  Bed Mobility Overal bed mobility: Needs Assistance Bed Mobility: Supine to Sit     Supine to sit: Supervision;HOB elevated     General bed mobility comments: supervision for safety, pt able to progress his right leg over EOB after warm up exercises and used HOB and rails for support at trunk to get to sitting.  Extra time needed to complete task.   Transfers Overall transfer level: Needs assistance Equipment used: Rolling walker (2 wheeled) Transfers: Sit to/from Stand Sit to Stand: Min assist;From elevated surface         General transfer comment: Min assist from elevated bed to support trunk during painful transitions.  Verbal cues for safe hand placement.   Ambulation/Gait Ambulation/Gait assistance: Min assist Ambulation Distance (Feet): 10 Feet Assistive device: Rolling walker (2 wheeled) Gait Pattern/deviations: Step-to pattern;Antalgic     General Gait Details: Verbal cues for correct LE sequencing, safe use of RW.  Min assist for balance.          Balance Overall balance assessment: Needs assistance Sitting-balance support: Feet supported;Bilateral upper extremity supported Sitting balance-Leahy Scale: Fair     Standing balance support: Bilateral upper extremity supported Standing balance-Leahy Scale: Poor                               Pertinent Vitals/Pain Pain Assessment: 0-10 Pain Score: 5  Pain Location: right leg Pain Descriptors / Indicators: Grimacing;Guarding Pain Intervention(s): Limited activity within patient's tolerance;Monitored during session;Repositioned    Home Living Family/patient expects to be discharged to:: Private residence Living Arrangements: Spouse/significant other Available Help at Discharge: Family;Available PRN/intermittently Type of Home: House Home Access: Stairs to enter Entrance Stairs-Rails: Right;Left;Can reach both Entrance Stairs-Number of Steps: 4 Home Layout: One level Home Equipment: Walker - 2 wheels;Shower seat (per pt report are being delivered, wonder if it is 3-in-1)      Prior Function Level of Independence: Independent         Comments: works as a Librarian, academic, drives a lot, not much physical work with his job        Extremity/Trunk Assessment   Upper Extremity Assessment: Defer to OT evaluation           Lower Extremity Assessment: RLE deficits/detail RLE Deficits / Details: right leg with normal post op pain and weakness, anke at least 4/5, knee 3-/5, hip 2/5  Cervical / Trunk Assessment: Other exceptions  Communication   Communication: No difficulties  Cognition Arousal/Alertness: Lethargic;Suspect due to medications Behavior During Therapy: [facility] for tasks assessed/performed Overall Cognitive Status: Within Functional Limits for tasks assessed                         Exercises Total Joint Exercises Ankle  Circles/Pumps: AROM;Both;20 reps Quad Sets: AROM;Right;10 reps Heel Slides: AAROM;Right;10 reps Hip ABduction/ADduction: AAROM;Right;10 reps   Assessment/Plan    PT Assessment Patient needs continued PT services  PT Problem List Decreased strength;Decreased range of motion;Decreased activity tolerance;Decreased balance;Decreased mobility;Decreased knowledge of use of DME;Obesity;Pain          PT Treatment Interventions DME instruction;Gait training;Stair training;Functional mobility training;Therapeutic activities;Therapeutic exercise;Balance training;Patient/family education;Manual techniques;Modalities    PT Goals (Current goals can be found in the Care Plan section)  Acute Rehab PT Goals Patient Stated Goal: to get home in 2-3 days PT Goal Formulation: With patient/family Time For Goal Achievement: 06/16/16 Potential to Achieve Goals: Good    Frequency 7X/week           End of Session Equipment Utilized During Treatment: Gait belt Activity Tolerance: Patient limited by pain Patient left: in chair;with call bell/phone within reach;with family/visitor present Nurse Communication: Mobility status         Time: [id number] PT Time Calculation (min) (ACUTE ONLY): 23 min   Charges:   PT Evaluation $PT Eval Moderate Complexity: 1 Procedure PT Treatments $Therapeutic Exercise: 8-22 mins        Kesleigh Morson B. Delissa Silba, PT, DPT [phone]   06/09/2016, 4:51 PM

## 2016-06-09 NOTE — Anesthesia Postprocedure Evaluation (Signed)
Anesthesia Post Note  Patient: Connor Brewer  Procedure(s) Performed: Procedure(s) (LRB): TOTAL HIP ARTHROPLASTY ANTERIOR APPROACH (Right)  Patient location during evaluation: PACU Anesthesia Type: Spinal Level of consciousness: oriented and awake and alert Pain management: pain level controlled Vital Signs Assessment: post-procedure vital signs reviewed and stable Respiratory status: spontaneous breathing, respiratory function stable and patient connected to nasal cannula oxygen Cardiovascular status: blood pressure returned to baseline and stable Postop Assessment: no headache and no backache Anesthetic complications: no    Last Vitals:  Vitals:   06/09/16 1344 06/09/16 1359  BP: (!) 144/67 (!) 158/94  Pulse: (!) 46 (!) 52  Resp: 15 14  Temp:      Last Pain:  Vitals:   06/09/16 0838  TempSrc: Oral                 Grazia Taffe DAVID

## 2016-06-09 NOTE — Anesthesia Preprocedure Evaluation (Addendum)
Anesthesia Evaluation  Patient identified by MRN, date of birth, ID band Patient awake    Reviewed: Allergy & Precautions, NPO status , Patient's Chart, lab work & pertinent test results  History of Anesthesia Complications (+) PONV  Airway Mallampati: I  TM Distance: >3 FB Neck ROM: Full    Dental   Pulmonary sleep apnea , Current Smoker,    Pulmonary exam normal        Cardiovascular hypertension, Normal cardiovascular exam     Neuro/Psych    GI/Hepatic GERD  Medicated and Controlled,  Endo/Other    Renal/GU      Musculoskeletal   Abdominal   Peds  Hematology   Anesthesia Other Findings   Reproductive/Obstetrics                            Anesthesia Physical Anesthesia Plan  ASA: III  Anesthesia Plan: Spinal and MAC   Post-op Pain Management:    Induction: Intravenous  Airway Management Planned: Natural Airway and Simple Face Mask  Additional Equipment:   Intra-op Plan:   Post-operative Plan:   Informed Consent: I have reviewed the patients History and Physical, chart, labs and discussed the procedure including the risks, benefits and alternatives for the proposed anesthesia with the patient or authorized representative who has indicated his/her understanding and acceptance.     Plan Discussed with: CRNA and Surgeon  Anesthesia Plan Comments:         Anesthesia Quick Evaluation

## 2016-06-09 NOTE — Anesthesia Procedure Notes (Signed)
Spinal  Patient location during procedure: OR Start time: 06/09/2016 11:00 AM End time: 06/09/2016 11:05 AM Staffing Anesthesiologist: Lillia Abed Performed: anesthesiologist  Preanesthetic Checklist Completed: patient identified, surgical consent, pre-op evaluation, timeout performed, IV checked, risks and benefits discussed and monitors and equipment checked Spinal Block Patient position: sitting Prep: DuraPrep Patient monitoring: heart rate, cardiac monitor, continuous pulse ox and blood pressure Approach: right paramedian Location: L3-4 Injection technique: single-shot Needle Needle type: Pencan  Needle gauge: 24 G Needle length: 9 cm Needle insertion depth: 6 cm

## 2016-06-09 NOTE — Interval H&P Note (Signed)
History and Physical Interval Note:  06/09/2016 9:11 AM  Connor Brewer  has presented today for surgery, with the diagnosis of RIGHT HIP OSTEOARTHRITIS  The various methods of treatment have been discussed with the patient and family. After consideration of risks, benefits and other options for treatment, the patient has consented to  Procedure(s): TOTAL HIP ARTHROPLASTY ANTERIOR APPROACH (Right) as a surgical intervention .  The patient's history has been reviewed, patient examined, no change in status, stable for surgery.  I have reviewed the patient's chart and labs.  Questions were answered to the patient's satisfaction.     Kerin Salen

## 2016-06-09 NOTE — Transfer of Care (Signed)
Immediate Anesthesia Transfer of Care Note  Patient: Connor Brewer  Procedure(s) Performed: Procedure(s): TOTAL HIP ARTHROPLASTY ANTERIOR APPROACH (Right)  Patient Location: PACU  Anesthesia Type:Spinal  Level of Consciousness: sedated  Airway & Oxygen Therapy: Patient Spontanous Breathing and Patient connected to face mask oxygen  Post-op Assessment: Report given to RN and Post -op Vital signs reviewed and stable  Post vital signs: Reviewed and stable  Last Vitals:  Vitals:   06/09/16 0838 06/09/16 1228  BP: (!) 147/85   Pulse: (!) 55   Resp: 20   Temp: 36.8 C 36.1 C    Last Pain:  Vitals:   06/09/16 0838  TempSrc: Oral      Patients Stated Pain Goal: 1 ([id number])  Complications: No apparent anesthesia complications

## 2016-06-09 NOTE — Op Note (Signed)
OPERATIVE REPORT    DATE OF PROCEDURE:  06/09/2016       PREOPERATIVE DIAGNOSIS:  RIGHT HIP OSTEOARTHRITIS                                                          POSTOPERATIVE DIAGNOSIS:  RIGHT HIP OSTEOARTHRITIS                                                           PROCEDURE: Anterior R total hip arthroplasty using a 52 mm DePuy Pinnacle  Cup, [employer], 0-degree polyethylene liner, a +1,5 36 mm ceramic head, a 5 Depuy Triloc stem   SURGEON: Sanya Kobrin J    ASSISTANT:   Eric K[employer]  (present throughout entire procedure and necessary for timely completion of the procedure)   ANESTHESIA: Spinal BLOOD LOSS: 400 FLUID REPLACEMENT: 1600 crystalloid Antibiotic: 3gm ancef Tranexamic Acid: 2gm topical COMPLICATIONS: none    INDICATIONS FOR PROCEDURE: A 55 y.o. year-old With  RIGHT HIP OSTEOARTHRITIS   for 3 years, x-rays show bone-on-bone arthritic changes, and osteophytes. Despite conservative measures with observation, anti-inflammatory medicine, narcotics, use of a cane, has severe unremitting pain and can ambulate only a few blocks before resting. Patient desires elective R total hip arthroplasty to decrease pain and increase function. The risks, benefits, and alternatives were discussed at length including but not limited to the risks of infection, bleeding, nerve injury, stiffness, blood clots, the need for revision surgery, cardiopulmonary complications, among others, and they were willing to proceed. Questions answered     PROCEDURE IN DETAIL: The patient was identified by armband,  received preoperative IV antibiotics in the holding area at [facility], taken to the operating room , appropriate anesthetic monitors  were attached and  anesthesia was induced with the patienton the gurney. The HANA boots were applied to the feet and he was then transferred to the HANA table with a peroneal post and support underneath the non-operative le, which  was locked in 5 lb traction. Theoperative lower extremity was then prepped and draped in the usual sterile fashion from just above the iliac crest to the knee. And a timeout procedure was performed. We then made a 12 cm incision along the interval at the leading edge of the tensor fascia lata of starting at 2 cm lateral to and 2 cm distal to the ASIS. Small bleeders in the skin and subcutaneous tissue identified and cauterized we dissected down to the fascia and made an incision in the fascia allowing [place] to elevate the fascia of the tensor muscle and exploited the interval between the rectus and the tensor fascia lata. A Hohmann retractor was then placed along the superior neck of the femur and a Cobra retractor along the inferior neck of the femur we teed the capsule starting out at the superior anterior aspect of the acetabulum going distally and made the T along the neck both leaflets of the T were tagged with #2 Ethibond suture. Cobra retractors were then placed along the inferior and superior neck allowing [place] to perform a standard neck cut and removed the femoral  head with a power corkscrew. We then placed a right angle Hohmann retractor along the anterior aspect of the acetabulum a spiked Cobra in the cotyloid notch and posteriorly a Muelller retractor. We then sequentially reamed up to a 52 mm basket reamer obtaining good coverage in all quadrants, verified by C-arm imaging. Under C-arm control with and hammered into place a 52 mm Pinnacle cup in 45 of abduction and 15 of anteversion. The cup seated nicely and required no supplemental screws. We then placed a central hole Eliminator and a 0 polyethylene liner. The foot was then externally rotated to 110, the HANA elevator was placed around the flare of the greater trochanter and the limb was extended and abducted delivering the proximal femur up into the wound. A medium Hohmann retractor was placed over the greater trochanter and a Mueller retractor along  the posterior femoral neck completing the exposure. We then performed releases superiorly and and inferiorly of the capsule going back to the pirformis fossa superiorly and to the lesser trochanter inferiorly. We then entered the proximal femur with the box cutting offset chisel followed by, a canal sounder, the chili pepper and broaching up to a 5 broach. This seated nicely and we reamed the calcar. A trial reduction was performed with a 1.5 mm 1.5 36 mm head.The limb lengths were excellent the hip was stable in 90 of external rotation. At this point the trial components removed and we hammered into place a # 5 Tri-Lock stem with Gryption coating. This was a std offset stem and a + 1.5 36 mm ceramic ball was then hammered into place the hip was reduced and final C-arm images obtained. The wound was thoroughly irrigated with normal saline solution. We repaired the ant capsule and the tensor fascia lot a with running 0 vicryl suture. the subcutaneous tissue was closed with 2-0 and 3-0 Vicryl suture followed by an Aquacil dressing. At this point the patient was awaken and transferred to hospital gurney without difficulty.The subcutaneous tissue with 0 and 2-0 undyed Vicryl suture and the skin with running 3-0 vicryl subcuticular suture. Aquacil dressing was applied. The patient was then unclamped, rolled supine, awaken extubated and taken to recovery room without difficulty in stable condition.   Leanthony Rhett J 06/09/2016, 11:48 AM

## 2016-06-09 NOTE — Discharge Instructions (Signed)

## 2016-06-10 ENCOUNTER — Encounter (HOSPITAL_COMMUNITY): Payer: Self-pay | Admitting: Orthopedic Surgery

## 2016-06-10 LAB — CBC
HCT: 38.2 % — ABNORMAL LOW (ref 39.0–52.0)
Hemoglobin: 12.6 g/dL — ABNORMAL LOW (ref 13.0–17.0)
MCH: 30.1 pg (ref 26.0–34.0)
MCHC: 33 g/dL (ref 30.0–36.0)
MCV: 91.2 fL (ref 78.0–100.0)
PLATELETS: 136 10*3/uL — AB (ref 150–400)
RBC: 4.19 MIL/uL — ABNORMAL LOW (ref 4.22–5.81)
RDW: 13.5 % (ref 11.5–15.5)
WBC: 9.5 10*3/uL (ref 4.0–10.5)

## 2016-06-10 LAB — BASIC METABOLIC PANEL
ANION GAP: 9 (ref 5–15)
BUN: 12 mg/dL (ref 6–20)
CALCIUM: 8.7 mg/dL — AB (ref 8.9–10.3)
CO2: 25 mmol/L (ref 22–32)
Chloride: 101 mmol/L (ref 101–111)
Creatinine, Ser: 0.89 mg/dL (ref 0.61–1.24)
GLUCOSE: 145 mg/dL — AB (ref 65–99)
Potassium: 3.9 mmol/L (ref 3.5–5.1)
Sodium: 135 mmol/L (ref 135–145)

## 2016-06-10 LAB — GLUCOSE, CAPILLARY
GLUCOSE-CAPILLARY: 171 mg/dL — AB (ref 65–99)
Glucose-Capillary: 149 mg/dL — ABNORMAL HIGH (ref 65–99)
Glucose-Capillary: 142 mg/dL — ABNORMAL HIGH (ref 65–99)
Glucose-Capillary: 162 mg/dL — ABNORMAL HIGH (ref 65–99)

## 2016-06-10 LAB — HEMOGLOBIN A1C
Hgb A1c MFr Bld: 5.5 % (ref 4.8–5.6)
MEAN PLASMA GLUCOSE: 111 mg/dL

## 2016-06-10 MED ORDER — CALCIUM CARBONATE ANTACID 500 MG PO CHEW
1.0000 | CHEWABLE_TABLET | ORAL | Status: DC | PRN
  Administered 2016-06-11 (×3): 200 mg via ORAL
  Filled 2016-06-10 (×4): qty 1

## 2016-06-10 NOTE — Progress Notes (Signed)
Physical Therapy Treatment Patient Details Name: Connor Brewer MRN: [id number] DOB: 1960-09-29 Today's Date: 06/10/2016    History of Present Illness 55 y.o. male admitted to [facility] on 06/09/16 for elective R direct anterior THA.  Pt with significant PMHx of Pre diabetes, obesity, HTN, DVT, back surgery, right arm, left knee and right knee fracture surgeries.      PT Comments    Patient is progressing toward mobility goals. Pt tolerated increased gait distance this session. Continue HEP and stair training next session. Continue to progress as tolerated with anticipated d/c home with HHPT.   Follow Up Recommendations  Home health PT;Supervision for mobility/OOB     Equipment Recommendations  None recommended by PT;Other (comment) (already delivered to home)    Recommendations for Other Services       Precautions / Restrictions Precautions Precautions: Fall Restrictions Weight Bearing Restrictions: Yes RLE Weight Bearing: Weight bearing as tolerated    Mobility  Bed Mobility               General bed mobility comments: pt OOB in chair upon arrival  Transfers Overall transfer level: Needs assistance Equipment used: Rolling walker (2 wheeled) Transfers: Sit to/from Stand Sit to Stand: Min assist;Min guard         General transfer comment: assist to power up into standing; min guard when descending to relciner; cues for safe hand placement  Ambulation/Gait Ambulation/Gait assistance: Min guard Ambulation Distance (Feet): 170 Feet Assistive device: Rolling walker (2 wheeled) Gait Pattern/deviations: Step-to pattern;Step-through pattern;Decreased stance time - right;Decreased step length - left;Decreased weight shift to right     General Gait Details: cues for postue, increased L knee/hip flexion and heel strike; pt with improved step through pattern and foot clearance with increased distance   Stairs            Wheelchair Mobility    Modified Rankin  (Stroke Patients Only)       Balance     Sitting balance-Leahy Scale: Good       Standing balance-Leahy Scale: Fair                      Cognition Arousal/Alertness: Awake/alert Behavior During Therapy: WFL for tasks assessed/performed Overall Cognitive Status: Within Functional Limits for tasks assessed                      Exercises Total Joint Exercises Knee Flexion: AROM;Right;10 reps;Standing Marching in Standing: AROM;Right;10 reps;Standing    General Comments General comments (skin integrity, edema, etc.): wife present for session      Pertinent Vitals/Pain Pain Assessment: 0-10 Pain Score: 8  Pain Location: R hip/thigh Pain Descriptors / Indicators: Grimacing;Guarding;Sore Pain Intervention(s): Limited activity within patient's tolerance;Monitored during session;Repositioned;Ice applied;RN gave pain meds during session;Premedicated before session    Home Living                      Prior Function            PT Goals (current goals can now be found in the care plan section) Acute Rehab PT Goals Patient Stated Goal: to get home in 2-3 days PT Goal Formulation: With patient/family Time For Goal Achievement: 06/16/16 Potential to Achieve Goals: Good Progress towards PT goals: Progressing toward goals    Frequency    7X/week      PT Plan Current plan remains appropriate    Co-evaluation  End of Session Equipment Utilized During Treatment: Gait belt Activity Tolerance: Patient tolerated treatment well Patient left: in chair;with call bell/phone within reach;with family/visitor present;with nursing/sitter in room     Time: 1015-1049 PT Time Calculation (min) (ACUTE ONLY): 34 min  Charges:  $Gait Training: 8-22 mins $Therapeutic Exercise: 8-22 mins                    G Codes:      [place] April, PTA Pager: [phone]   06/10/2016, 10:55 AM

## 2016-06-10 NOTE — Progress Notes (Addendum)
PATIENT ID: Connor Brewer  MRN: [id number]  DOB/AGE:  53/53/1962 / 53 y.o.  1 Day Post-Op Procedure(s) (LRB): TOTAL HIP ARTHROPLASTY ANTERIOR APPROACH (Right)    PROGRESS NOTE Subjective: Patient is alert, oriented, x1 Nausea, no Vomiting, yes passing gas, . Taking PO well. Denies SOB, Chest or Calf Pain. Using Incentive Spirometer, PAS in place. Ambulate WBAT, bed to chair Patient reports pain as  7/10. Had Foley placed last night for inability to void 2.  Objective: Vital signs in last 24 hours: Vitals:   06/09/16 2026 06/09/16 2200 06/09/16 2345 06/10/16 0433  BP: 135/71  (!) 123/57 (!) 139/58  Pulse:  61 67 (!) 58  Resp: 17 18 18 16   Temp: 98.2 F (36.8 C)  98.7 F (37.1 C) 97.9 F (36.6 C)  TempSrc: Oral  Oral Oral  SpO2: 96% 98% 96% 97%      Intake/Output from previous day: I/O last 3 completed shifts: In: 1940 [P.O.:240; I.V.:1700] Out: 3525 [Urine:3225; Blood:300]   Intake/Output this shift: No intake/output data recorded.   LABORATORY DATA:  Recent Labs  06/09/16 1232 06/09/16 2024 06/10/16 0501 06/10/16 0630  WBC  --   --  9.5  --   HGB  --   --  12.6*  --   HCT  --   --  38.2*  --   PLT  --   --  136*  --   NA  --   --  135  --   K  --   --  3.9  --   CL  --   --  101  --   CO2  --   --  25  --   BUN  --   --  12  --   CREATININE  --   --  0.89  --   GLUCOSE  --   --  145*  --   GLUCAP 94 176*  --  149*  CALCIUM  --   --  8.7*  --     Examination: Neurologically intact ABD soft Neurovascular intact Sensation intact distally Intact pulses distally Dorsiflexion/Plantar flexion intact Incision: dressing C/D/I No cellulitis present Compartment soft} XR AP&Lat of hip shows well placed\fixed THA  Assessment:   1 Day Post-Op Procedure(s) (LRB): TOTAL HIP ARTHROPLASTY ANTERIOR APPROACH (Right) ADDITIONAL DIAGNOSIS:  Expected Acute Blood Loss Anemia, NIDDM,Morbid obesity, sleep apnea, hypertension, history of DVT in the past  Plan: PT/OT  WBAT, THA,Discontinue Foley today.  DVT Prophylaxis: SCDx72 hrs, ASA 325 mg BID x 2 weeks  DISCHARGE PLAN: Home, Patient understands need to work with physical therapy. He expressed desire to go to inpatient rehabilitation versus SNF. Explained that insurance would not cover inpatient rehabilitation, and SNF carries a higher rate of complications such as DVT and urinary problems.  DISCHARGE NEEDS: HHPT, Walker and 3-in-1 comode seatPatient ID: Connor Brewer, male   DOB: September 05, 1960, 55 y.o.   MRN: [id number]

## 2016-06-10 NOTE — Evaluation (Signed)
Occupational Therapy Evaluation Patient Details Name: Connor Brewer MRN: [id number] DOB: 1960/12/15 Today's Date: 06/10/2016    History of Present Illness 55 y.o. male admitted to [facility] on 06/09/16 for elective R direct anterior THA.  Pt with significant PMHx of Pre diabetes, obesity, HTN, DVT, back surgery, right arm, left knee and right knee fracture surgeries.     Clinical Impression   PTA, pt had returned to independence with ADL and IADL. Pt had been going through rehabilitation post R patellar fracture but was functioning at PLOF prior to this surgery. Pt currently requires moderate assistance with LB ADL and min assist for toilet transfer. Pt lives with his wife but will have only intermittent supervision at D/C due to wife's work schedule. Pt may have family members who will be able to provide assistance 24 hours/day for a few days post-acute D/C. Pt would benefit from continued OT services while admitted to improve independence with ADL and functional mobility. Recommend home health OT services for OT follow-up and 24 hour assistance. OT will continue to follow acutely with focus on tub transfers and AE education for LB ADL.     Follow Up Recommendations  Home health OT;Supervision/Assistance - 24 hour    Equipment Recommendations  None recommended by OT (all needs met per pt report)    Recommendations for Other Services       Precautions / Restrictions Precautions Precautions: Fall Restrictions Weight Bearing Restrictions: Yes RLE Weight Bearing: Weight bearing as tolerated      Mobility Bed Mobility Overal bed mobility: Needs Assistance Bed Mobility: Sit to Supine     Supine to sit: Supervision;HOB elevated Sit to supine: Min assist   General bed mobility comments: cues for technique; assist to elevate R LE into bed; bed elevated to simulate pt's bed at home  Transfers Overall transfer level: Needs assistance Equipment used: Rolling walker (2 wheeled) Transfers:  Sit to/from Stand Sit to Stand: Min assist         General transfer comment: Min assist from lower surface during simulated toilet transfer.    Balance Overall balance assessment: Needs assistance Sitting-balance support: Feet supported;No upper extremity supported Sitting balance-Leahy Scale: Good     Standing balance support: Bilateral upper extremity supported;No upper extremity supported Standing balance-Leahy Scale: Fair Standing balance comment: Able to stand at sink statically to wash hands with no UE support. Reliant on BUE support for functional ambulation.                            ADL Overall ADL's : Needs assistance/impaired     Grooming: Min guard;Wash/dry hands   Upper Body Bathing: Set up;Sitting   Lower Body Bathing: Moderate assistance;Sit to/from stand   Upper Body Dressing : Set up;Sitting   Lower Body Dressing: Moderate assistance;Sit to/from stand   Toilet Transfer: Minimal assistance;Ambulation;RW Toilet Transfer Details (indicate cue type and reason): Min assist sit<>stand; min guard for functional ambulation. Toileting- Clothing Manipulation and Hygiene: Minimal assistance;Sit to/from stand       Functional mobility during ADLs: Min guard;Rolling walker;Minimal assistance General ADL Comments: Educated pt on dressing techniques and potential use of AE. Pt requesting information on seat cushions as recliner at home is too low.     Vision Vision Assessment?: No apparent visual deficits   Perception     Praxis      Pertinent Vitals/Pain Pain Assessment: Faces Faces Pain Scale: Hurts little more Pain Location: R hip/thigh Pain  Descriptors / Indicators: Grimacing;Guarding;Sore Pain Intervention(s): Limited activity within patient's tolerance;Monitored during session;Repositioned;Ice applied     Hand Dominance Right   Extremity/Trunk Assessment Upper Extremity Assessment Upper Extremity Assessment: Overall WFL for tasks  assessed   Lower Extremity Assessment Lower Extremity Assessment: Defer to PT evaluation       Communication Communication Communication: No difficulties   Cognition Arousal/Alertness: Awake/alert Behavior During Therapy: WFL for tasks assessed/performed Overall Cognitive Status: Within Functional Limits for tasks assessed                     General Comments       Exercises Exercises: Total Joint     Shoulder Instructions      Home Living Family/patient expects to be discharged to:: Private residence Living Arrangements: Spouse/significant other Available Help at Discharge: Family;Available PRN/intermittently (pt's wife works in home health and sometimes will be gone for 24 hours. Pt may have other family to stay for first few days post-acute D/C.) Type of Home: House Home Access: Stairs to enter [employer] of Steps: 4 Entrance Stairs-Rails: Right;Left;Can reach both Home Layout: One level     Bathroom Shower/Tub: Tub/shower unit Shower/tub characteristics: Architectural technologist: Standard Bathroom Accessibility: Yes   Home Equipment: Museum/gallery conservator - 2 wheels;Shower seat;Bedside commode;Cane - single point (RW and shower seat being delivered (wonder if this is 3-in-1)          Prior Functioning/Environment Level of Independence: Independent        Comments: works as a Librarian, academic, drives a lot, not much physical work with his job        OT Problem List: Decreased strength;Decreased range of motion;Decreased activity tolerance;Impaired balance (sitting and/or standing);Decreased safety awareness;Decreased knowledge of use of DME or AE;Decreased knowledge of precautions;Pain   OT Treatment/Interventions: Self-care/ADL training;Therapeutic exercise;Energy conservation;Therapeutic activities;Patient/family education;Balance training    OT Goals(Current goals can be found in the care plan section) Acute Rehab OT Goals Patient  Stated Goal: go home OT Goal Formulation: With patient Time For Goal Achievement: 06/24/16 Potential to Achieve Goals: Good ADL Goals Pt Will Perform Lower Body Bathing: sit to/from stand;with adaptive equipment;with modified independence Pt Will Perform Lower Body Dressing: with modified independence;with adaptive equipment;sit to/from stand Pt Will Transfer to Toilet: with modified independence;ambulating;bedside commode Pt Will Perform Toileting - Clothing Manipulation and hygiene: with modified independence;sit to/from stand Pt Will Perform Tub/Shower Transfer: with modified independence;Tub transfer;ambulating;rolling walker;3 in 1;shower seat Additional ADL Goal #1: Pt will independently complete bed mobility (with bed elevated to simulate home) as a precursor to ADL independence.   OT Frequency: Min 2X/week   Barriers to D/C:            Co-evaluation              End of Session Equipment Utilized During Treatment: Gait belt;Rolling walker Nurse Communication: Other (comment) (Pt requesting meds for heartburn)  Activity Tolerance: Patient tolerated treatment well Patient left: in bed;with call bell/phone within reach   Time: 1501-1531 OT Time Calculation (min): 30 min Charges:  OT General Charges $OT Visit: 1 Procedure OT Evaluation $OT Eval Moderate Complexity: 1 Procedure OT Treatments $Self Care/Home Management : 8-22 mins  Norman Herrlich, OTR/L [phone] 06/10/2016, 4:44 PM

## 2016-06-10 NOTE — Progress Notes (Addendum)
Physical Therapy Treatment Patient Details Name: Connor Brewer MRN: [id number] DOB: 27-Jan-1961 Today's Date: 06/10/2016    History of Present Illness 55 y.o. male admitted to [facility] on 06/09/16 for elective R direct anterior THA.  Pt with significant PMHx of Pre diabetes, obesity, HTN, DVT, back surgery, right arm, left knee and right knee fracture surgeries.      PT Comments    Patient is progressing well toward mobility goals. Pt tolerated stair training this session. Current plan remains appropriate.   Follow Up Recommendations  Home health PT;Supervision for mobility/OOB     Equipment Recommendations  None recommended by PT;Other (comment) (already delivered to home)    Recommendations for Other Services       Precautions / Restrictions Precautions Precautions: Fall Restrictions Weight Bearing Restrictions: Yes RLE Weight Bearing: Weight bearing as tolerated    Mobility  Bed Mobility Overal bed mobility: Needs Assistance Bed Mobility: Sit to Supine       Sit to supine: Min assist   General bed mobility comments: cues for technique; assist to elevate R LE into bed; bed elevated to simulate pt's bed at home  Transfers Overall transfer level: Needs assistance Equipment used: Rolling walker (2 wheeled) Transfers: Sit to/from Stand Sit to Stand: Min guard         General transfer comment: min guard for safety  Ambulation/Gait Ambulation/Gait assistance: Supervision Ambulation Distance (Feet): 185 Feet Assistive device: Rolling walker (2 wheeled) Gait Pattern/deviations: Step-through pattern;Decreased stance time - right;Decreased step length - left;Decreased weight shift to right     General Gait Details: cues for posture and proximity of RW; improved cadence   Stairs Stairs: Yes   Stair Management: One rail Right;Sideways Number of Stairs: 5 General stair comments: cues for sequencing/technique  Wheelchair Mobility    Modified Rankin (Stroke  Patients Only)       Balance     Sitting balance-Leahy Scale: Good       Standing balance-Leahy Scale: Fair                      Cognition Arousal/Alertness: Awake/alert Behavior During Therapy: WFL for tasks assessed/performed Overall Cognitive Status: Within Functional Limits for tasks assessed                      Exercises Total Joint Exercises Heel Slides: AROM;Right;10 reps Hip ABduction/ADduction: AROM;Right;10 reps Knee Flexion: AROM;Right;10 reps;Standing    General Comments        Pertinent Vitals/Pain Pain Assessment: Faces Faces Pain Scale: Hurts little more Pain Location: R hip/thigh Pain Descriptors / Indicators: Grimacing;Guarding;Sore Pain Intervention(s): Limited activity within patient's tolerance;Monitored during session;Premedicated before session;Repositioned    Home Living                      Prior Function            PT Goals (current goals can now be found in the care plan section) Acute Rehab PT Goals Patient Stated Goal: go home PT Goal Formulation: With patient/family Time For Goal Achievement: 06/16/16 Potential to Achieve Goals: Good Progress towards PT goals: Progressing toward goals    Frequency    7X/week      PT Plan Current plan remains appropriate    Co-evaluation             End of Session Equipment Utilized During Treatment: Gait belt Activity Tolerance: Patient tolerated treatment well Patient left: in chair;with call bell/phone within  reach     Time: 1410-1448 PT Time Calculation (min) (ACUTE ONLY): 38 min  Charges:  $Gait Training: 8-22 mins $Therapeutic Exercise: 8-22 mins $Therapeutic Activity: 8-22 mins                    G Codes:      Salina April, PTA Pager: [phone]   06/10/2016, 3:05 PM

## 2016-06-10 NOTE — Progress Notes (Signed)
Placed patient on CPAP for the night with pressure set at 11cm  

## 2016-06-11 LAB — CBC
HEMATOCRIT: 34.8 % — AB (ref 39.0–52.0)
HEMOGLOBIN: 11.9 g/dL — AB (ref 13.0–17.0)
MCH: 30.8 pg (ref 26.0–34.0)
MCHC: 34.2 g/dL (ref 30.0–36.0)
MCV: 90.2 fL (ref 78.0–100.0)
Platelets: 135 10*3/uL — ABNORMAL LOW (ref 150–400)
RBC: 3.86 MIL/uL — ABNORMAL LOW (ref 4.22–5.81)
RDW: 13.1 % (ref 11.5–15.5)
WBC: 10.3 10*3/uL (ref 4.0–10.5)

## 2016-06-11 LAB — GLUCOSE, CAPILLARY
GLUCOSE-CAPILLARY: 145 mg/dL — AB (ref 65–99)
GLUCOSE-CAPILLARY: 116 mg/dL — AB (ref 65–99)

## 2016-06-11 NOTE — Care Management Note (Signed)
Case Management Note  Patient Details  Name: Connor Brewer MRN: [id number] Date of Birth: Feb 08, 1961  Subjective/Objective:  55 yr old gentleman s/p right total hip arthroplasty.                  Action/Plan: Case manager spoke with patient and his wife concerning [place]. Case manager contacted [place] to get [place] arranged, informed that patient has been assigned to [facility]. CM informed patient and contacted [facility] to Fax necessary documentation. Patient states rolling walker and 3in1 have been delivered to his home. He is waiting for 3in1 to be exchanged for bariatric one.  Patient will have family support at discharge.   Expected Discharge Date:    06/11/16               Expected Discharge Plan:  [place]  In-House Referral:  NA  Discharge planning Services  CM Consult  Post Acute Care Choice:  Home Health Choice offered to:  Patient  DME Arranged:  N/A DME Agency:  NA  HH Arranged:  PT HH Agency:  [facility]  Status of Service:  Completed, signed off  If discussed at [place] of Stay Meetings, dates discussed:    Additional Comments:  Ninfa Meeker, RN 06/11/2016, 10:58 AM

## 2016-06-11 NOTE — Progress Notes (Signed)
Physical Therapy Treatment Patient Details Name: ALEXS BIFANO MRN: [id number] DOB: 03-17-1961 Today's Date: 06/11/2016    History of Present Illness 55 y.o. male admitted to [facility] on 06/09/16 for elective R direct anterior THA.  Pt with significant PMHx of Pre diabetes, obesity, HTN, DVT, back surgery, right arm, left knee and right knee fracture surgeries.      PT Comments    Patient is making good progress with PT.  From a mobility standpoint anticipate patient will be ready for DC home when medically ready.     Follow Up Recommendations  Home health PT;Supervision for mobility/OOB     Equipment Recommendations  None recommended by PT;Other (comment) (already delivered to home)    Recommendations for Other Services       Precautions / Restrictions Precautions Precautions: Fall Restrictions Weight Bearing Restrictions: Yes RLE Weight Bearing: Weight bearing as tolerated    Mobility  Bed Mobility               General bed mobility comments: pt ambulating in room with wife present  Transfers Overall transfer level: Modified independent Equipment used: Rolling walker (2 wheeled) Transfers: Sit to/from Stand           General transfer comment: carry over of safe hand placement and technique; increased time/effort  Ambulation/Gait Ambulation/Gait assistance: Modified independent (Device/Increase time) Ambulation Distance (Feet): 12 Feet Assistive device: Rolling walker (2 wheeled) Gait Pattern/deviations: Step-through pattern;Decreased stride length     General Gait Details: cues for posture; pt with improved WB and step length symmetry this session   Stairs Stairs: Yes   Stair Management: One rail Right;Sideways Number of Stairs: 5 General stair comments: carry over of sequencing/technique; min guard for safety  Wheelchair Mobility    Modified Rankin (Stroke Patients Only)       Balance     Sitting balance-Leahy Scale: Good       Standing  balance-Leahy Scale: Fair                      Cognition Arousal/Alertness: Awake/alert Behavior During Therapy: WFL for tasks assessed/performed Overall Cognitive Status: Within Functional Limits for tasks assessed                      Exercises Total Joint Exercises Heel Slides: AROM;Right;15 reps;Seated Hip ABduction/ADduction: AROM;Right;15 reps;Standing Long Arc Quad: AROM;Right;15 reps;Seated Knee Flexion: AROM;Right;15 reps;Standing Marching in Standing: AROM;Right;15 reps;Standing    General Comments General comments (skin integrity, edema, etc.): pt educated on car transfer and practiced from low surface to simulate getting into car with min guard      Pertinent Vitals/Pain Pain Assessment: Faces Faces Pain Scale: Hurts a little bit Pain Location: R hip/thigh Pain Descriptors / Indicators: Sore Pain Intervention(s): Limited activity within patient's tolerance;Monitored during session;Premedicated before session;Repositioned    Home Living                      Prior Function            PT Goals (current goals can now be found in the care plan section) Acute Rehab PT Goals Patient Stated Goal: go home PT Goal Formulation: With patient/family Time For Goal Achievement: 06/16/16 Potential to Achieve Goals: Good Progress towards PT goals: Progressing toward goals    Frequency    7X/week      PT Plan Current plan remains appropriate    Co-evaluation  End of Session Equipment Utilized During Treatment: Gait belt Activity Tolerance: Patient tolerated treatment well Patient left: with call bell/phone within reach;with family/visitor present;in chair     Time: [id number] PT Time Calculation (min) (ACUTE ONLY): 11 min  Charges:   $Therapeutic Exercise: 8-22 mins                    G Codes:      Salina April, PTA Pager: [phone]   06/11/2016, 1:43 PM

## 2016-06-11 NOTE — Plan of Care (Signed)
Problem: Pain Management: Goal: Pain level will decrease with appropriate interventions Outcome: Progressing Medicated once for pain with full relief  Problem: Safety: Goal: Ability to remain free from injury will improve Outcome: Progressing No fall or injuries noted, safety precautions and fall preventions maintained  Problem: Physical Regulation: Goal: Will remain free from infection Outcome: Progressing No s/s of infection noted  Problem: Tissue Perfusion: Goal: Risk factors for ineffective tissue perfusion will decrease Outcome: Progressing Denies S/S of DVT  Problem: Bowel/Gastric: Goal: Will not experience complications related to bowel motility Outcome: Progressing No bowel or gastric issues reported

## 2016-06-11 NOTE — Progress Notes (Signed)
Patient discharged to home with belongings, IVs removed. AVS printed and given. Prescriptions given, all questions answered. Teach back performed. Patient stable at time of discharge.

## 2016-06-11 NOTE — Progress Notes (Signed)
Physical Therapy Treatment Patient Details Name: Connor Brewer MRN: [id number] DOB: 07-24-1960 Today's Date: 06/11/2016    History of Present Illness 55 y.o. male admitted to [facility] on 06/09/16 for elective R direct anterior THA.  Pt with significant PMHx of Pre diabetes, obesity, HTN, DVT, back surgery, right arm, left knee and right knee fracture surgeries.      PT Comments    Patient is progressing well toward mobility goals. Review HEP next session. Current plan remains appropriate.   Follow Up Recommendations  Home health PT;Supervision for mobility/OOB     Equipment Recommendations  None recommended by PT;Other (comment) (already delivered to home)    Recommendations for Other Services       Precautions / Restrictions Precautions Precautions: Fall Restrictions Weight Bearing Restrictions: Yes RLE Weight Bearing: Weight bearing as tolerated    Mobility  Bed Mobility               General bed mobility comments: pt OOB in chair upon arrival  Transfers Overall transfer level: Modified independent Equipment used: Rolling walker (2 wheeled) Transfers: Sit to/from Stand Sit to Stand: Supervision         General transfer comment: carry over of safe hand placement and technique; increased time/effort  Ambulation/Gait Ambulation/Gait assistance: Supervision Ambulation Distance (Feet): 350 Feet Assistive device: Rolling walker (2 wheeled) Gait Pattern/deviations: Step-through pattern;Decreased weight shift to right;Decreased stride length     General Gait Details: cues for posture; pt with improved WB and step length symmetry this session   Stairs Stairs: Yes   Stair Management: One rail Right;Sideways Number of Stairs: 5 General stair comments: carry over of sequencing/technique; min guard for safety  Wheelchair Mobility    Modified Rankin (Stroke Patients Only)       Balance Overall balance assessment: Needs assistance Sitting-balance support:  Feet supported;No upper extremity supported Sitting balance-Leahy Scale: Good     Standing balance support: Bilateral upper extremity supported;No upper extremity supported;During functional activity Standing balance-Leahy Scale: Fair                      Cognition Arousal/Alertness: Awake/alert Behavior During Therapy: WFL for tasks assessed/performed Overall Cognitive Status: Within Functional Limits for tasks assessed                      Exercises      General Comments General comments (skin integrity, edema, etc.): pt educated on car transfer and practiced from low surface to simulate getting into car with min guard      Pertinent Vitals/Pain Pain Assessment: Faces Faces Pain Scale: Hurts little more Pain Location: R hip/thigh Pain Descriptors / Indicators: Grimacing;Guarding;Sore Pain Intervention(s): Limited activity within patient's tolerance;Monitored during session;Premedicated before session;Repositioned;Patient requesting pain meds-RN notified    Home Living                      Prior Function            PT Goals (current goals can now be found in the care plan section) Acute Rehab PT Goals Patient Stated Goal: go home PT Goal Formulation: With patient/family Time For Goal Achievement: 06/16/16 Potential to Achieve Goals: Good Progress towards PT goals: Progressing toward goals    Frequency    7X/week      PT Plan Current plan remains appropriate    Co-evaluation             End of Session Equipment Utilized During  Treatment: Gait belt Activity Tolerance: Patient tolerated treatment well Patient left: with call bell/phone within reach;with family/visitor present;Other (comment) (pt in rest room)     Time: [id number] PT Time Calculation (min) (ACUTE ONLY): 27 min  Charges:  $Gait Training: 8-22 mins $Therapeutic Activity: 8-22 mins                    G Codes:      Salina April, PTA Pager:  [phone]   06/11/2016, 11:15 AM

## 2016-06-11 NOTE — Progress Notes (Signed)
PATIENT ID: Connor Brewer  MRN: [id number]  DOB/AGE:  1961/04/18 / 55 y.o.  2 Days Post-Op Procedure(s) (LRB): TOTAL HIP ARTHROPLASTY ANTERIOR APPROACH (Right)    PROGRESS NOTE Subjective: Patient is alert, oriented, no Nausea, no Vomiting, yes passing gas, . Taking PO well. Denies SOB, Chest or Calf Pain. Using Incentive Spirometer, PAS in place. Ambulate WBAT with pt walking 185 ft with therapy Patient reports pain as  4-5/10  .    Objective: Vital signs in last 24 hours: Vitals:   06/10/16 0433 06/10/16 1334 06/10/16 2027 06/11/16 0408  BP: (!) 139/58 (!) 145/58 (!) 136/55 (!) 138/59  Pulse: (!) 58 (!) 56 (!) 57 60  Resp: 16 18 16 16   Temp: 97.9 F (36.6 C) 98.4 F (36.9 C) 98.9 F (37.2 C) 98.1 F (36.7 C)  TempSrc: Oral Oral Oral Oral  SpO2: 97% 95% 96% 98%      Intake/Output from previous day: I/O last 3 completed shifts: In: 3746.7 [P.O.:1080; I.V.:2666.7] Out: [id number]   Intake/Output this shift: No intake/output data recorded.   LABORATORY DATA:  Recent Labs  06/10/16 0501  06/10/16 1715 06/10/16 2134 06/11/16 0321 06/11/16 0635  WBC 9.5  --   --   --  10.3  --   HGB 12.6*  --   --   --  11.9*  --   HCT 38.2*  --   --   --  34.8*  --   PLT 136*  --   --   --  135*  --   NA 135  --   --   --   --   --   K 3.9  --   --   --   --   --   CL 101  --   --   --   --   --   CO2 25  --   --   --   --   --   BUN 12  --   --   --   --   --   CREATININE 0.89  --   --   --   --   --   GLUCOSE 145*  --   --   --   --   --   GLUCAP  --   < > 171* 162*  --  145*  CALCIUM 8.7*  --   --   --   --   --   < > = values in this interval not displayed.  Examination: Neurologically intact Neurovascular intact Sensation intact distally Intact pulses distally Dorsiflexion/Plantar flexion intact Incision: dressing C/D/I No cellulitis present Compartment soft} XR AP&Lat of hip shows well placed\fixed THA  Assessment:   2 Days Post-Op Procedure(s)  (LRB): TOTAL HIP ARTHROPLASTY ANTERIOR APPROACH (Right) ADDITIONAL DIAGNOSIS:  Expected Acute Blood Loss Anemia, NIDDM,Morbid obesity, sleep apnea, hypertension, history of DVT in the past  Plan: PT/OT WBAT, THA  DVT Prophylaxis: SCDx72 hrs, ASA 325 mg BID x 2 weeks  DISCHARGE PLAN: Home  DISCHARGE NEEDS: HHPT, Walker and 3-in-1 comode seat

## 2016-06-11 NOTE — Discharge Summary (Signed)
Patient ID: Connor Brewer MRN: [id number] DOB/AGE: 1960/12/10 55 y.o.  Admit date: 06/09/2016 Discharge date: 06/11/2016  Admission Diagnoses:  Principal Problem:   Primary osteoarthritis of right hip   Discharge Diagnoses:  Same  Past Medical History:  Diagnosis Date  . Bronchitis    " aalergy driven"  . DVT (deep venous thrombosis) (HCC)    LLE  . GERD (gastroesophageal reflux disease)   . H/O seasonal allergies   . Hypertension   . Obesity   . Osteoarthritis    right hip  . PONV (postoperative nausea and vomiting)   . Pre-diabetes   . Sleep apnea    wears CPAP    Surgeries: Procedure(s): TOTAL HIP ARTHROPLASTY ANTERIOR APPROACH on 06/09/2016   Consultants:   Discharged Condition: Improved  Hospital Course: Connor Brewer is an 55 y.o. male who was admitted 06/09/2016 for operative treatment ofPrimary osteoarthritis of right hip. Patient has severe unremitting pain that affects sleep, daily activities, and work/hobbies. After pre-op clearance the patient was taken to the operating room on 06/09/2016 and underwent  Procedure(s): TOTAL HIP ARTHROPLASTY ANTERIOR APPROACH.    Patient was given perioperative antibiotics: Anti-infectives    Start     Dose/Rate Route Frequency Ordered Stop   06/09/16 1000  ceFAZolin (ANCEF) 3 g in dextrose 5 % 50 mL IVPB     3 g 130 mL/hr over 30 Minutes Intravenous To ShortStay Surgical 06/06/16 1041 06/09/16 1025       Patient was given sequential compression devices, early ambulation, and chemoprophylaxis to prevent DVT.  Patient benefited maximally from hospital stay and there were no complications.    Recent vital signs: Patient Vitals for the past 24 hrs:  BP Temp Temp src Pulse Resp SpO2  06/11/16 0408 (!) 138/59 98.1 F (36.7 C) Oral 60 16 98 %  06/10/16 2027 (!) 136/55 98.9 F (37.2 C) Oral (!) 57 16 96 %  06/10/16 1334 (!) 145/58 98.4 F (36.9 C) Oral (!) 56 18 95 %     Recent laboratory studies:  Recent Labs  06/10/16 0501 06/11/16 0321  WBC 9.5 10.3  HGB 12.6* 11.9*  HCT 38.2* 34.8*  PLT 136* 135*  NA 135  --   K 3.9  --   CL 101  --   CO2 25  --   BUN 12  --   CREATININE 0.89  --   GLUCOSE 145*  --   CALCIUM 8.7*  --      Discharge Medications:     Medication List    STOP taking these medications   nabumetone 750 MG tablet Commonly known as:  RELAFEN     TAKE these medications   amLODipine 5 MG tablet Commonly known as:  NORVASC Take 5 mg by mouth daily.   aspirin EC 325 MG tablet Take 1 tablet (325 mg total) by mouth 2 (two) times daily.   fexofenadine 180 MG tablet Commonly known as:  ALLEGRA Take 180 mg by mouth daily. May take Xyzal occasionally in place of Allegra   losartan-hydrochlorothiazide 50-12.5 MG tablet Commonly known as:  HYZAAR Take 1 tablet by mouth 2 (two) times daily.   MEGARED OMEGA-3 KRILL OIL 500 MG Caps Take 1 capsule by mouth 2 (two) times daily.   metFORMIN 750 MG 24 hr tablet Commonly known as:  GLUCOPHAGE-XR Take 750 mg by mouth daily with breakfast.   methocarbamol 500 MG tablet Commonly known as:  ROBAXIN Take 1 tablet (500 mg total) by mouth 2 (  two) times daily with a meal. What changed:  when to take this   multivitamin with minerals tablet Take 1 tablet by mouth daily.   omeprazole 20 MG capsule Commonly known as:  PRILOSEC Take 20 mg by mouth daily.   oxyCODONE-acetaminophen 5-325 MG tablet Commonly known as:  ROXICET Take 1 tablet by mouth every 4 (four) hours as needed.            Durable Medical Equipment        Start     Ordered   06/09/16 1459  DME Walker rolling  Once    Question:  Patient needs a walker to treat with the following condition  Answer:  Primary osteoarthritis of right hip   06/09/16 1458   06/09/16 1459  DME 3 n 1  Once     06/09/16 1458   06/09/16 1459  DME Bedside commode  Once    Question:  Patient needs a bedside commode to treat with the following condition  Answer:  Primary  osteoarthritis of right hip   06/09/16 1458      Diagnostic Studies: Dg C-arm 1-60 Min  Result Date: 06/09/2016 CLINICAL DATA:  Status post anterior approach right hip fracture. Fluoro time reported is 17 seconds EXAM: DG C-ARM 61-120 MIN; OPERATIVE RIGHT HIP WITH PELVIS COMPARISON:  None in PACs FINDINGS: Two fluoro spot images reveal placement of a total right hip joint prosthesis. The positioning of the prosthesis appears good. The interface with the native bone is normal. No acute native bone abnormality is observed. IMPRESSION: No immediate complication following anterior approach right total hip joint prosthesis placement. Electronically Signed   By: David  Martinique M.D.   On: 06/09/2016 11:59   Dg Hip Operative Unilat With Pelvis Right  Result Date: 06/09/2016 CLINICAL DATA:  Status post anterior approach right hip fracture. Fluoro time reported is 17 seconds EXAM: DG C-ARM 61-120 MIN; OPERATIVE RIGHT HIP WITH PELVIS COMPARISON:  None in PACs FINDINGS: Two fluoro spot images reveal placement of a total right hip joint prosthesis. The positioning of the prosthesis appears good. The interface with the native bone is normal. No acute native bone abnormality is observed. IMPRESSION: No immediate complication following anterior approach right total hip joint prosthesis placement. Electronically Signed   By: David  Martinique M.D.   On: 06/09/2016 11:59    Disposition: Final discharge disposition not confirmed  Discharge Instructions    Call MD / Call 911    Complete by:  As directed    If you experience chest pain or shortness of breath, CALL 911 and be transported to the hospital emergency room.  If you develope a fever above 101 F, pus (white drainage) or increased drainage or redness at the wound, or calf pain, call your surgeon's office.   Constipation Prevention    Complete by:  As directed    Drink plenty of fluids.  Prune juice may be helpful.  You may use a stool softener, such as Colace  (over the counter) 100 mg twice a day.  Use MiraLax (over the counter) for constipation as needed.   Diet - low sodium heart healthy    Complete by:  As directed    Driving restrictions    Complete by:  As directed    No driving for 2 weeks   Follow the hip precautions as taught in Physical Therapy    Complete by:  As directed    Increase activity slowly as tolerated    Complete by:  As directed    Patient may shower    Complete by:  As directed    You may shower without a dressing once there is no drainage.  Do not wash over the wound.  If drainage remains, cover wound with plastic wrap and then shower.      Follow-up Information    Kerin Salen, MD Follow up in 2 week(s).   Specialty:  Orthopedic Surgery Contact information: [place] [address] [phone]            Signed: Hardin Negus Ali Mclaurin R 06/11/2016, 7:51 AM

## 2016-06-11 NOTE — Progress Notes (Signed)
Occupational Therapy Treatment/Discharge Patient Details Name: Connor Brewer MRN: [id number] DOB: 08/16/1960 Today's Date: 06/11/2016    History of present illness 55 y.o. male admitted to [facility] on 06/09/16 for elective R direct anterior THA.  Pt with significant PMHx of Pre diabetes, obesity, HTN, DVT, back surgery, right arm, left knee and right knee fracture surgeries.     OT comments  Pt progressing well toward OT goals. Pt educated on use of AE for LB ADL and demonstrated ability to complete with supervision after education completed. Additionally educated on DME for tub transfers and pt able to complete tub transfer with min guard assist using 3-in-1 BSC. Pt has shower chair and 3-in-1 delivered to his home but reports 3-in-1 was easier to use this session and will use that at home. All education complete concerning post-operative ADL, fall prevention, and energy conservation. D/C plan remains appropriate. OT will sign off acutely.   Follow Up Recommendations  Home health OT;Supervision/Assistance - 24 hour    Equipment Recommendations  None recommended by OT (all needs met per pt report)    Recommendations for Other Services      Precautions / Restrictions Precautions Precautions: Fall Restrictions Weight Bearing Restrictions: No RLE Weight Bearing: Weight bearing as tolerated       Mobility Bed Mobility               General bed mobility comments: Received in recliner  Transfers Overall transfer level: Needs assistance Equipment used: Rolling walker (2 wheeled) Transfers: Sit to/from Stand Sit to Stand: Supervision              Balance Overall balance assessment: Needs assistance Sitting-balance support: Feet supported;No upper extremity supported Sitting balance-Leahy Scale: Good     Standing balance support: Bilateral upper extremity supported;No upper extremity supported;During functional activity Standing balance-Leahy Scale: Fair                     ADL Overall ADL's : Needs assistance/impaired                     Lower Body Dressing: Supervision/safety;With adaptive equipment;Sit to/from stand   Toilet Transfer: Supervision/safety;Ambulation;RW   Toileting- Clothing Manipulation and Hygiene: Supervision/safety;Sit to/from stand   Tub/ Shower Transfer: Min guard;Tub transfer;3 in 1;Rolling walker;Ambulation   Functional mobility during ADLs: Supervision/safety;Rolling walker General ADL Comments: Continued education concerning AE for LB ADL and tub transfer with 3-in-1. Pt demonstrates understanding of all.      Vision                     Perception     Praxis      Cognition   Behavior During Therapy: WFL for tasks assessed/performed Overall Cognitive Status: Within Functional Limits for tasks assessed                       Extremity/Trunk Assessment               Exercises     Shoulder Instructions       General Comments      Pertinent Vitals/ Pain       Pain Assessment: Faces Faces Pain Scale: Hurts little more Pain Location: R hip/thigh Pain Descriptors / Indicators: Aching;Sore Pain Intervention(s): Limited activity within patient's tolerance;Monitored during session;Repositioned  Home Living  Prior Functioning/Environment              Frequency  Min 2X/week        Progress Toward Goals  OT Goals(current goals can now be found in the care plan section)  Progress towards OT goals: Progressing toward goals  Acute Rehab OT Goals Patient Stated Goal: go home OT Goal Formulation: With patient Time For Goal Achievement: 06/24/16 Potential to Achieve Goals: Good ADL Goals Pt Will Perform Lower Body Bathing: sit to/from stand;with adaptive equipment;with modified independence Pt Will Perform Lower Body Dressing: with modified independence;with adaptive equipment;sit to/from stand Pt Will  Transfer to Toilet: with modified independence;ambulating;bedside commode Pt Will Perform Toileting - Clothing Manipulation and hygiene: with modified independence;sit to/from stand Pt Will Perform Tub/Shower Transfer: with modified independence;Tub transfer;ambulating;rolling walker;3 in 1;shower seat Additional ADL Goal #1: Pt will independently complete bed mobility (with bed elevated to simulate home) as a precursor to ADL independence.   Plan Discharge plan remains appropriate    Co-evaluation                 End of Session Equipment Utilized During Treatment: Gait belt;Rolling walker   Activity Tolerance Patient tolerated treatment well   Patient Left with call bell/phone within reach;in chair   Nurse Communication          Time: 0300-9233 OT Time Calculation (min): 29 min  Charges: OT General Charges $OT Visit: 1 Procedure OT Treatments $Self Care/Home Management : 23-37 mins  Norman Herrlich, OTR/L [phone] 06/11/2016, 8:50 AM

## 2017-01-13 DIAGNOSIS — C499 Malignant neoplasm of connective and soft tissue, unspecified: Secondary | ICD-10-CM | POA: Insufficient documentation

## 2017-01-13 NOTE — Progress Notes (Deleted)
[facility]  [phone] [phone]  ID: Connor Brewer OB: 11-20-1960  MR#: [id number]  VEL#:[id number]  Patient Care Team: Juluis Pitch, MD as PCP - General (Family Medicine)  CHIEF COMPLAINT: Leiomyosarcoma.  INTERVAL HISTORY: ***  REVIEW OF SYSTEMS:   ROS  As per HPI. Otherwise, a complete review of systems is negative.  PAST MEDICAL HISTORY: Past Medical History:  Diagnosis Date  . Bronchitis    " aalergy driven"  . DVT (deep venous thrombosis) (HCC)    LLE  . GERD (gastroesophageal reflux disease)   . H/O seasonal allergies   . Hypertension   . Obesity   . Osteoarthritis    right hip  . PONV (postoperative nausea and vomiting)   . Pre-diabetes   . Sleep apnea    wears CPAP    PAST SURGICAL HISTORY: Past Surgical History:  Procedure Laterality Date  . BACK SURGERY     laser spine  . COLONOSCOPY W/ BIOPSIES AND POLYPECTOMY    . FRACTURE SURGERY     right arm, left knee, right knee  . MULTIPLE TOOTH EXTRACTIONS    . TOTAL HIP ARTHROPLASTY Right 06/09/2016   Procedure: TOTAL HIP ARTHROPLASTY ANTERIOR APPROACH;  Surgeon: Frederik Pear, MD;  Location: [facility];  Service: Orthopedics;  Laterality: Right;    FAMILY HISTORY: Family History  Problem Relation Age of Onset  . Lung cancer Father     ADVANCED DIRECTIVES (Y/N):  N  HEALTH MAINTENANCE: Social History  Substance Use Topics  . Smoking status: Current Some Day Smoker    Types: Cigars  . Smokeless tobacco: Never Used  . Alcohol use Yes     Comment: social     Colonoscopy:  PAP:  Bone density:  Lipid panel:  Allergies  Allergen Reactions  . Ivp Dye [Iodinated Diagnostic Agents] Anaphylaxis and Swelling    THROAT, LIPS SWELL [Reaction took place in 1983.]    Current Outpatient Prescriptions  Medication Sig Dispense Refill  . amLODipine (NORVASC) 5 MG tablet Take 5 mg by mouth daily.    Marland Kitchen aspirin EC 325 MG tablet Take 1 tablet (325 mg total) by  mouth 2 (two) times daily. 30 tablet 0  . fexofenadine (ALLEGRA) 180 MG tablet Take 180 mg by mouth daily. May take Xyzal occasionally in place of Allegra    . losartan-hydrochlorothiazide (HYZAAR) 50-12.5 MG tablet Take 1 tablet by mouth 2 (two) times daily.    Marland Kitchen MEGARED OMEGA-3 KRILL OIL 500 MG CAPS Take 1 capsule by mouth 2 (two) times daily.    . metFORMIN (GLUCOPHAGE-XR) 750 MG 24 hr tablet Take 750 mg by mouth daily with breakfast.    . methocarbamol (ROBAXIN) 500 MG tablet Take 1 tablet (500 mg total) by mouth 2 (two) times daily with a meal. 60 tablet 0  . Multiple Vitamins-Minerals (MULTIVITAMIN WITH MINERALS) tablet Take 1 tablet by mouth daily.    Marland Kitchen omeprazole (PRILOSEC) 20 MG capsule Take 20 mg by mouth daily.    Marland Kitchen oxyCODONE-acetaminophen (ROXICET) 5-325 MG tablet Take 1 tablet by mouth every 4 (four) hours as needed. 60 tablet 0   No current facility-administered medications for this visit.     OBJECTIVE: There were no vitals filed for this visit.   There is no height or weight on file to calculate BMI.    ECOG FS:{CHL ONC [id number]  General: Well-developed, well-nourished, no acute distress. Eyes: Pink conjunctiva, anicteric sclera. HEENT: Normocephalic, moist mucous membranes, clear oropharnyx. Lungs: Clear to auscultation  bilaterally. Heart: Regular rate and rhythm. No rubs, murmurs, or gallops. Abdomen: Soft, nontender, nondistended. No organomegaly noted, normoactive bowel sounds. Musculoskeletal: No edema, cyanosis, or clubbing. Neuro: Alert, answering all questions appropriately. Cranial nerves grossly intact. Skin: No rashes or petechiae noted. Psych: Normal affect. Lymphatics: No cervical, calvicular, axillary or inguinal LAD.   LAB RESULTS:  Lab Results  Component Value Date   NA 135 06/10/2016   K 3.9 06/10/2016   CL 101 06/10/2016   CO2 25 06/10/2016   GLUCOSE 145 (H) 06/10/2016   BUN 12 06/10/2016   CREATININE 0.89 06/10/2016   CALCIUM 8.7 (L)  06/10/2016   PROT 8.3 (H) 07/05/2014   ALBUMIN 4.0 07/05/2014   AST 44 (H) 07/05/2014   ALT 58 07/05/2014   ALKPHOS 90 07/05/2014   BILITOT 0.4 07/05/2014   GFRNONAA >60 06/10/2016   GFRAA >60 06/10/2016    Lab Results  Component Value Date   WBC 10.3 06/11/2016   NEUTROABS 4.2 05/30/2016   HGB 11.9 (L) 06/11/2016   HCT 34.8 (L) 06/11/2016   MCV 90.2 06/11/2016   PLT 135 (L) 06/11/2016     STUDIES: No results found.  ASSESSMENT:  Leiomyosarcoma  PLAN:    1. Leiomyosarcoma:  Patient expressed understanding and was in agreement with this plan. He also understands that He can call clinic at any time with any questions, concerns, or complaints.   Cancer Staging No matching staging information was found for the patient.  [name], MD   01/13/2017 11:54 PM

## 2017-01-14 ENCOUNTER — Inpatient Hospital Stay: Payer: Managed Care, Other (non HMO) | Admitting: Oncology

## 2017-01-16 ENCOUNTER — Encounter: Payer: Self-pay | Admitting: Oncology

## 2017-01-16 ENCOUNTER — Inpatient Hospital Stay: Payer: Managed Care, Other (non HMO) | Attending: Oncology | Admitting: Oncology

## 2017-01-16 VITALS — BP 134/84 | HR 50 | Temp 96.0°F | Wt 271.4 lb

## 2017-01-16 DIAGNOSIS — M1990 Unspecified osteoarthritis, unspecified site: Secondary | ICD-10-CM | POA: Diagnosis not present

## 2017-01-16 DIAGNOSIS — R7303 Prediabetes: Secondary | ICD-10-CM | POA: Insufficient documentation

## 2017-01-16 DIAGNOSIS — C4921 Malignant neoplasm of connective and soft tissue of right lower limb, including hip: Secondary | ICD-10-CM

## 2017-01-16 DIAGNOSIS — G4730 Sleep apnea, unspecified: Secondary | ICD-10-CM | POA: Insufficient documentation

## 2017-01-16 DIAGNOSIS — C499 Malignant neoplasm of connective and soft tissue, unspecified: Secondary | ICD-10-CM

## 2017-01-16 DIAGNOSIS — Z96641 Presence of right artificial hip joint: Secondary | ICD-10-CM | POA: Insufficient documentation

## 2017-01-16 DIAGNOSIS — I10 Essential (primary) hypertension: Secondary | ICD-10-CM | POA: Insufficient documentation

## 2017-01-16 DIAGNOSIS — E669 Obesity, unspecified: Secondary | ICD-10-CM | POA: Diagnosis not present

## 2017-01-16 DIAGNOSIS — F17210 Nicotine dependence, cigarettes, uncomplicated: Secondary | ICD-10-CM

## 2017-01-16 DIAGNOSIS — K219 Gastro-esophageal reflux disease without esophagitis: Secondary | ICD-10-CM | POA: Insufficient documentation

## 2017-01-16 DIAGNOSIS — Z79899 Other long term (current) drug therapy: Secondary | ICD-10-CM | POA: Insufficient documentation

## 2017-01-16 NOTE — Progress Notes (Signed)
[facility] Initial Visit:  Patient Care Team: Juluis Pitch, MD as PCP - General (Family Medicine)  CHIEF COMPLAINTS/PURPOSE OF CONSULTATION:  I have skin cancer. HISTORY OF PRESENTING ILLNESS: Connor Brewer 56 y.o. male with past medical history listed as the is referred by Dr. 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 Of note from Dr. Nehemiah Massed, biopsy showed leiomyosarcoma and patient had excision of this lesion on December 30 2016. Pathology showed residual leiomyosarcoma less than 0.1 mm from 3:00 black inked margin. Microscopic description in this larger reexcision, there is scar and biopsy site changes with residual vesicle all leiomyosarcoma. Notably the black inked margin is less than 0.1 mm from residual leiomyosarcoma but appears narrowly free on H&E, smallest muscle actin and desmin stains with a problem rate staining controls. Patient comes for suggestion of further management. Patient reports doing pretty well without any complaints today he has dressing placed on top of his surgical site. He denies any pain, shortness of breath ,cough, abdominal pain, or lower extremity swelling..   Review of Systems  Constitutional: Negative.   HENT:  Negative.   Eyes: Negative.   Respiratory: Negative.   Cardiovascular: Negative.   Gastrointestinal: Negative.   Endocrine: Negative.   Genitourinary: Negative.    Musculoskeletal: Negative.   Skin: Positive for wound.  Neurological: Negative.   Hematological: Negative.   Psychiatric/Behavioral: Negative.     MEDICAL HISTORY: Past Medical History:  Diagnosis Date  . Bronchitis    " aalergy driven"  . DVT (deep venous thrombosis) (HCC)    LLE  . GERD (gastroesophageal reflux disease)   . H/O seasonal allergies   .  Hypertension   . Obesity   . Osteoarthritis    right hip  . PONV (postoperative nausea and vomiting)   . Pre-diabetes   . Sleep apnea    wears CPAP    SURGICAL HISTORY: Past Surgical History:  Procedure Laterality Date  . BACK SURGERY     laser spine  . COLONOSCOPY W/ BIOPSIES AND POLYPECTOMY    . FRACTURE SURGERY     right arm, left knee, right knee  . MULTIPLE TOOTH EXTRACTIONS    . TOTAL HIP ARTHROPLASTY Right 06/09/2016   Procedure: TOTAL HIP ARTHROPLASTY ANTERIOR APPROACH;  Surgeon: Frederik Pear, MD;  Location: [facility];  Service: Orthopedics;  Laterality: Right;    SOCIAL HISTORY: Social History   Social History  . Marital status: Married    Spouse name: N/A  . Number of children: N/A  . Years of education: N/A   Occupational History  . Not on file.   Social History Main Topics  . Smoking status: Current Some Day Smoker    Types: Cigars  . Smokeless tobacco: Never Used  . Alcohol use Yes     Comment: social  . Drug use: No  . Sexual activity: Not on file   Other Topics Concern  . Not on file   Social History Narrative  . No narrative on file    FAMILY HISTORY Family History  Problem Relation Age of Onset  . Lung cancer Father   . Cancer Brother     ALLERGIES:  is allergic to ivp dye [iodinated diagnostic agents] and metrizamide.  MEDICATIONS:  Current Outpatient Prescriptions  Medication Sig Dispense Refill  . amLODipine (NORVASC) 5  MG tablet Take 5 mg by mouth daily.    . fexofenadine (ALLEGRA) 180 MG tablet Take 180 mg by mouth daily. May take Xyzal occasionally in place of Allegra    . losartan-hydrochlorothiazide (HYZAAR) 50-12.5 MG tablet Take 1 tablet by mouth 2 (two) times daily.    Marland Kitchen MEGARED OMEGA-3 KRILL OIL 500 MG CAPS Take 1 capsule by mouth 2 (two) times daily.    . meloxicam (MOBIC) 15 MG tablet Take by mouth.    . methocarbamol (ROBAXIN) 500 MG tablet Take 1 tablet (500 mg total) by mouth 2 (two) times daily with a meal. 60 tablet 0   . Multiple Vitamins-Minerals (MULTIVITAMIN WITH MINERALS) tablet Take 1 tablet by mouth daily.    Marland Kitchen omeprazole (PRILOSEC) 20 MG capsule Take 20 mg by mouth daily.     No current facility-administered medications for this visit.     PHYSICAL EXAMINATION:  ECOG PERFORMANCE STATUS: 0 - Asymptomatic   Vitals:   01/16/17 1058  BP: 134/84  Pulse: (!) 50  Temp: (!) 96 F (35.6 C)    Filed Weights   01/16/17 1058  Weight: 271 lb 6.4 oz (123.1 kg)     Physical Exam GENERAL: No distress, well nourished.  SKIN:  No rashes or significant lesions  HEAD: Normocephalic, No masses, lesions, tenderness or abnormalities  EYES: Conjunctiva are pink, non icteric ENT: External ears normal ,lips , buccal mucosa, and tongue normal and mucous membranes are moist  LYMPH: No palpable cervical and axillary lymphadenopathy  LUNGS: Clear to auscultation, no crackles or wheezes HEART: Regular rate & rhythm, no murmurs, no gallops, S1 normal and S2 normal  ABDOMEN: Abdomen soft, non-tender, normal bowel sounds, I did not appreciate any  masses or organomegaly  MUSCULOSKELETAL: No CVA tenderness and no tenderness on percussion of the back or rib cage.  EXTREMITIES: No edema, + right pretibia wound, covered with dressing.  NEURO: Alert & oriented, no focal motor/sensory deficits.    LABORATORY DATA: I have personally reviewed the data as listed:  No visits with results within 1 Month(s) from this visit.  Latest known visit with results is:  Admission on 06/09/2016, Discharged on 06/11/2016  Component Date Value Ref Range Status  . Glucose-Capillary 06/09/2016 94  65 - 99 mg/dL Final  . Hgb A1c MFr Bld 06/09/2016 5.5  4.8 - 5.6 % Final   Comment: (NOTE)         Pre-diabetes: 5.7 - 6.4         Diabetes: >6.4         Glycemic control for adults with diabetes: <7.0   . Mean Plasma Glucose 06/09/2016 111  mg/dL Final   Comment: (NOTE) Performed At: [facility] [place], [place] [id number] Lindon Romp MD [id number]   . WBC 06/10/2016 9.5  4.0 - 10.5 K/uL Final  . RBC 06/10/2016 4.19* 4.22 - 5.81 MIL/uL Final  . Hemoglobin 06/10/2016 12.6* 13.0 - 17.0 g/dL Final  . HCT 06/10/2016 38.2* 39.0 - 52.0 % Final  . MCV 06/10/2016 91.2  78.0 - 100.0 fL Final  . MCH 06/10/2016 30.1  26.0 - 34.0 pg Final  . MCHC 06/10/2016 33.0  30.0 - 36.0 g/dL Final  . RDW 06/10/2016 13.5  11.5 - 15.5 % Final  . Platelets 06/10/2016 136* 150 - 400 K/uL Final  . Sodium 06/10/2016 135  135 - 145 mmol/L Final  . Potassium 06/10/2016 3.9  3.5 - 5.1 mmol/L Final  . Chloride 06/10/2016  101  101 - 111 mmol/L Final  . CO2 06/10/2016 25  22 - 32 mmol/L Final  . Glucose, Bld 06/10/2016 145* 65 - 99 mg/dL Final  . BUN 06/10/2016 12  6 - 20 mg/dL Final  . Creatinine, Ser 06/10/2016 0.89  0.61 - 1.24 mg/dL Final  . Calcium 06/10/2016 8.7* 8.9 - 10.3 mg/dL Final  . GFR calc non Af Amer 06/10/2016 >60  >60 mL/min Final  . GFR calc Af Amer 06/10/2016 >60  >60 mL/min Final   Comment: (NOTE) The eGFR has been calculated using the CKD EPI equation. This calculation has not been validated in all clinical situations. eGFR's persistently <60 mL/min signify possible Chronic Kidney Disease.   . Anion gap 06/10/2016 9  5 - 15 Final  . Glucose-Capillary 06/09/2016 176* 65 - 99 mg/dL Final  . Glucose-Capillary 06/10/2016 149* 65 - 99 mg/dL Final  . Glucose-Capillary 06/10/2016 142* 65 - 99 mg/dL Final  . WBC 06/11/2016 10.3  4.0 - 10.5 K/uL Final  . RBC 06/11/2016 3.86* 4.22 - 5.81 MIL/uL Final  . Hemoglobin 06/11/2016 11.9* 13.0 - 17.0 g/dL Final  . HCT 06/11/2016 34.8* 39.0 - 52.0 % Final  . MCV 06/11/2016 90.2  78.0 - 100.0 fL Final  . MCH 06/11/2016 30.8  26.0 - 34.0 pg Final  . MCHC 06/11/2016 34.2  30.0 - 36.0 g/dL Final  . RDW 06/11/2016 13.1  11.5 - 15.5 % Final  . Platelets 06/11/2016 135* 150 - 400 K/uL Final  . Glucose-Capillary 06/10/2016 171* 65 - 99 mg/dL Final  .  Glucose-Capillary 06/10/2016 162* 65 - 99 mg/dL Final  . Glucose-Capillary 06/11/2016 145* 65 - 99 mg/dL Final  . Glucose-Capillary 06/11/2016 116* 65 - 99 mg/dL Final    RADIOGRAPHIC STUDIES: I have personally reviewed the radiological images as listed and agree with the findings in the report  No results found.  ASSESSMENT/PLAN Cancer Staging Leiomyosarcoma [facility]) Staging form: Primary Cutaneous B-Cell/T-Cell Lymphoma (Non-MF/SS Lymphoma), AJCC 8th Edition - Clinical stage from 01/16/2017: cT1a, cN0 - Signed by Earlie Server, MD on 01/16/2017  #I discussed with the pathologist Dr. Tamala Julian over the phone today. 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 The margin is negative however very close.  Cutaneous leiomyosarcoma is a very rare type of cancer and current literature is limited for standardized management. Generally recommendation is wide excision, with 1 cm margin. In general cutaneous leiomyosarcoma as a good prognosis. I will call Dr. Al Decant to see if he can do additional resection. Subcutaneous leiomyosarcoma usually half worse prognosis. I will order a full stage workup including CT chest abdomen and pelvis with contrast as well as MRI of his lower right extremity to better visualize soft tissue. Patient understands the plan and agreed to proceed. He has chances to ask questions and all questions answered to his satisfaction.     Orders Placed This Encounter  Procedures  . CT Chest W Contrast    Standing Status:   Future    Standing Expiration Date:   01/16/2018    Order Specific Question:   If indicated for the ordered procedure, I authorize the administration of contrast media per Radiology protocol    Answer:   Yes    Order Specific Question:   Reason for Exam (SYMPTOM  OR DIAGNOSIS REQUIRED)    Answer:   leimyeosarcoma staging    Order Specific Question:    Preferred imaging location?  Answer:   [facility]    Order Specific Question:   Radiology Contrast Protocol - do NOT remove file path    Answer:   \\[email or web address]  . CT Abdomen Pelvis W Contrast    Standing Status:   Future    Standing Expiration Date:   01/16/2018    Order Specific Question:   If indicated for the ordered procedure, I authorize the administration of contrast media per Radiology protocol    Answer:   Yes    Order Specific Question:   Reason for Exam (SYMPTOM  OR DIAGNOSIS REQUIRED)    Answer:   leumyosarcoma staging    Order Specific Question:   Preferred imaging location?    Answer:   [facility]    Order Specific Question:   Radiology Contrast Protocol - do NOT remove file path    Answer:   \\[email or web address]  . MR TIBIA FIBULA LEFT W WO CONTRAST    Standing Status:   Future    Standing Expiration Date:   03/19/2018    Order Specific Question:   If indicated for the ordered procedure, I authorize the administration of contrast media per Radiology protocol    Answer:   Yes    Order Specific Question:   Reason for Exam (SYMPTOM  OR DIAGNOSIS REQUIRED)    Answer:   right pretibia skin leiomyosarcoma    Order Specific Question:   What is the patient's sedation requirement?    Answer:   No Sedation    Order Specific Question:   Does the patient have a pacemaker or implanted devices?    Answer:   Yes    Comments:   joint replacements    Order Specific Question:   Preferred imaging location?    Answer:   [facility] (table limit-300lbs)    Order Specific Question:   Radiology Contrast Protocol - do NOT remove file path    Answer:   \\[email or web address]    All questions were answered. The patient knows to call the clinic with any problems, questions or concerns.  Thank you for this kind referral and the opportunity to participate in the care of this patient.   Earlie Server MD  PhD [facility] [phone] Phone: [phone] 01/12/2017    Earlie Server, MD  01/16/2017 11:24 AM

## 2017-01-28 ENCOUNTER — Ambulatory Visit
Admission: RE | Admit: 2017-01-28 | Discharge: 2017-01-28 | Disposition: A | Discharge status: Home / Self Care | Payer: Managed Care, Other (non HMO) | Source: Ambulatory Visit | Attending: Oncology | Admitting: Oncology

## 2017-01-28 ENCOUNTER — Other Ambulatory Visit: Payer: Self-pay | Admitting: Oncology

## 2017-01-28 ENCOUNTER — Other Ambulatory Visit: Payer: Self-pay | Admitting: *Deleted

## 2017-01-28 DIAGNOSIS — C499 Malignant neoplasm of connective and soft tissue, unspecified: Secondary | ICD-10-CM | POA: Diagnosis present

## 2017-01-28 DIAGNOSIS — Z9889 Other specified postprocedural states: Secondary | ICD-10-CM | POA: Insufficient documentation

## 2017-01-28 MED ORDER — GADOBENATE DIMEGLUMINE 529 MG/ML IV SOLN
20.0000 mL | Freq: Once | INTRAVENOUS | Status: AC | PRN
  Administered 2017-01-28: 20 mL via INTRAVENOUS

## 2017-01-30 ENCOUNTER — Ambulatory Visit: Payer: Managed Care, Other (non HMO)

## 2017-02-02 ENCOUNTER — Ambulatory Visit: Payer: Managed Care, Other (non HMO)

## 2017-02-04 ENCOUNTER — Inpatient Hospital Stay: Payer: Managed Care, Other (non HMO) | Attending: Oncology | Admitting: Oncology

## 2017-02-04 ENCOUNTER — Encounter: Payer: Self-pay | Admitting: Oncology

## 2017-02-04 VITALS — BP 117/78 | HR 66 | Temp 97.5°F | Wt 268.2 lb

## 2017-02-04 DIAGNOSIS — M1990 Unspecified osteoarthritis, unspecified site: Secondary | ICD-10-CM | POA: Diagnosis not present

## 2017-02-04 DIAGNOSIS — Z79899 Other long term (current) drug therapy: Secondary | ICD-10-CM | POA: Diagnosis not present

## 2017-02-04 DIAGNOSIS — F17210 Nicotine dependence, cigarettes, uncomplicated: Secondary | ICD-10-CM

## 2017-02-04 DIAGNOSIS — G4730 Sleep apnea, unspecified: Secondary | ICD-10-CM | POA: Insufficient documentation

## 2017-02-04 DIAGNOSIS — K219 Gastro-esophageal reflux disease without esophagitis: Secondary | ICD-10-CM | POA: Diagnosis not present

## 2017-02-04 DIAGNOSIS — C4921 Malignant neoplasm of connective and soft tissue of right lower limb, including hip: Secondary | ICD-10-CM | POA: Insufficient documentation

## 2017-02-04 DIAGNOSIS — C499 Malignant neoplasm of connective and soft tissue, unspecified: Secondary | ICD-10-CM

## 2017-02-04 DIAGNOSIS — R7303 Prediabetes: Secondary | ICD-10-CM | POA: Insufficient documentation

## 2017-02-04 DIAGNOSIS — E669 Obesity, unspecified: Secondary | ICD-10-CM | POA: Insufficient documentation

## 2017-02-04 DIAGNOSIS — Z96641 Presence of right artificial hip joint: Secondary | ICD-10-CM | POA: Insufficient documentation

## 2017-02-04 DIAGNOSIS — I10 Essential (primary) hypertension: Secondary | ICD-10-CM | POA: Diagnosis not present

## 2017-02-04 NOTE — Progress Notes (Signed)
[facility] Follow up Visit:  Patient Care Team: Juluis Pitch, MD as PCP - General (Family Medicine)  CHIEF COMPLAINTS I have skin cancer.  HISTORY OF PRESENTING ILLNESS: Connor Brewer 56 y.o. male with past medical history listed as the is referred by Dr. 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 Of note from Dr. Nehemiah Massed, biopsy showed leiomyosarcoma and patient had excision of this lesion on December 30 2016. Pathology showed residual leiomyosarcoma less than 0.1 mm from 3:00 black inked margin. Microscopic description in this larger reexcision, there is scar and biopsy site changes with residual vesicle all leiomyosarcoma. Notably the black inked margin is less than 0.1 mm from residual leiomyosarcoma but appears narrowly free on H&E, smallest muscle actin and desmin stains with a problem rate staining controls. Patient comes for suggestion of further management. Patient reports doing pretty well without any complaints today he has dressing placed on top of his surgical site. He denies any pain, shortness of breath ,cough, abdominal pain, or lower extremity swelling..  INTERVAL HISTORY  Review of Systems  Constitutional: Negative.   HENT:  Negative.   Eyes: Negative.   Respiratory: Negative.   Cardiovascular: Negative.   Gastrointestinal: Negative.   Endocrine: Negative.   Genitourinary: Negative.    Musculoskeletal: Negative.   Skin: Positive for wound.  Neurological: Negative.   Hematological: Negative.   Psychiatric/Behavioral: Negative.     MEDICAL HISTORY: Past Medical History:  Diagnosis Date  . Bronchitis    " aalergy driven"  . DVT (deep venous thrombosis) (HCC)    LLE  . GERD (gastroesophageal reflux disease)   . H/O seasonal allergies   .  Hypertension   . Obesity   . Osteoarthritis    right hip  . PONV (postoperative nausea and vomiting)   . Pre-diabetes   . Sleep apnea    wears CPAP    SURGICAL HISTORY: Past Surgical History:  Procedure Laterality Date  . BACK SURGERY     laser spine  . COLONOSCOPY W/ BIOPSIES AND POLYPECTOMY    . FRACTURE SURGERY     right arm, left knee, right knee  . MULTIPLE TOOTH EXTRACTIONS    . TOTAL HIP ARTHROPLASTY Right 06/09/2016   Procedure: TOTAL HIP ARTHROPLASTY ANTERIOR APPROACH;  Surgeon: Frederik Pear, MD;  Location: [facility];  Service: Orthopedics;  Laterality: Right;    SOCIAL HISTORY: Social History   Social History  . Marital status: Married    Spouse name: N/A  . Number of children: N/A  . Years of education: N/A   Occupational History  . Not on file.   Social History Main Topics  . Smoking status: Current Some Day Smoker    Types: Cigars  . Smokeless tobacco: Never Used  . Alcohol use Yes     Comment: social  . Drug use: No  . Sexual activity: Not on file   Other Topics Concern  . Not on file   Social History Narrative  . No narrative on file    FAMILY HISTORY Family History  Problem Relation Age of Onset  . Lung cancer Father   . Cancer Brother     ALLERGIES:  is allergic to ivp dye [iodinated diagnostic agents] and metrizamide.  MEDICATIONS:  Current Outpatient Prescriptions  Medication Sig Dispense Refill  . amLODipine (NORVASC)  5 MG tablet Take 5 mg by mouth daily.    Marland Kitchen atorvastatin (LIPITOR) 20 MG tablet Take 20 mg by mouth daily.    . fexofenadine (ALLEGRA) 180 MG tablet Take 180 mg by mouth daily. May take Xyzal occasionally in place of Allegra    . losartan-hydrochlorothiazide (HYZAAR) 50-12.5 MG tablet Take 1 tablet by mouth 2 (two) times daily.    Marland Kitchen MEGARED OMEGA-3 KRILL OIL 500 MG CAPS Take 1 capsule by mouth 2 (two) times daily.    . meloxicam (MOBIC) 15 MG tablet Take by mouth.    . methocarbamol (ROBAXIN) 500 MG tablet Take 1 tablet  (500 mg total) by mouth 2 (two) times daily with a meal. 60 tablet 0  . Multiple Vitamins-Minerals (MULTIVITAMIN WITH MINERALS) tablet Take 1 tablet by mouth daily.    Marland Kitchen omeprazole (PRILOSEC) 20 MG capsule Take 20 mg by mouth daily.    . metFORMIN (GLUCOPHAGE-XR) 750 MG 24 hr tablet Take 750 mg by mouth daily.  3   No current facility-administered medications for this visit.     PHYSICAL EXAMINATION:  ECOG PERFORMANCE STATUS: 0 - Asymptomatic   Vitals:   02/04/17 1004  BP: 117/78  Pulse: 66  Temp: (!) 97.5 F (36.4 C)    Filed Weights   02/04/17 1004  Weight: 268 lb 3 oz (121.6 kg)     Physical Exam GENERAL: No distress, well nourished.  SKIN:  No rashes or significant lesions  HEAD: Normocephalic, No masses, lesions, tenderness or abnormalities  EYES: Conjunctiva are pink, non icteric ENT: External ears normal ,lips , buccal mucosa, and tongue normal and mucous membranes are moist  LYMPH: No palpable cervical and axillary lymphadenopathy  LUNGS: Clear to auscultation, no crackles or wheezes HEART: Regular rate & rhythm, no murmurs, no gallops, S1 normal and S2 normal  ABDOMEN: Abdomen soft, non-tender, normal bowel sounds, I did not appreciate any  masses or organomegaly  MUSCULOSKELETAL: No CVA tenderness and no tenderness on percussion of the back or rib cage.  EXTREMITIES: No edema, + right pretibia wound, covered with dressing.  NEURO: Alert & oriented, no focal motor/sensory deficits.    LABORATORY DATA: I have personally reviewed the data as listed:  No visits with results within 1 Month(s) from this visit.  Latest known visit with results is:  Admission on 06/09/2016, Discharged on 06/11/2016  Component Date Value Ref Range Status  . Glucose-Capillary 06/09/2016 94  65 - 99 mg/dL Final  . Hgb A1c MFr Bld 06/09/2016 5.5  4.8 - 5.6 % Final   Comment: (NOTE)         Pre-diabetes: 5.7 - 6.4         Diabetes: >6.4         Glycemic control for adults with  diabetes: <7.0   . Mean Plasma Glucose 06/09/2016 111  mg/dL Final   Comment: (NOTE) Performed At: [facility] [place], [place] [id number] Lindon Romp MD [id number]   . WBC 06/10/2016 9.5  4.0 - 10.5 K/uL Final  . RBC 06/10/2016 4.19* 4.22 - 5.81 MIL/uL Final  . Hemoglobin 06/10/2016 12.6* 13.0 - 17.0 g/dL Final  . HCT 06/10/2016 38.2* 39.0 - 52.0 % Final  . MCV 06/10/2016 91.2  78.0 - 100.0 fL Final  . MCH 06/10/2016 30.1  26.0 - 34.0 pg Final  . MCHC 06/10/2016 33.0  30.0 - 36.0 g/dL Final  . RDW 06/10/2016 13.5  11.5 - 15.5 % Final  . Platelets 06/10/2016  136* 150 - 400 K/uL Final  . Sodium 06/10/2016 135  135 - 145 mmol/L Final  . Potassium 06/10/2016 3.9  3.5 - 5.1 mmol/L Final  . Chloride 06/10/2016 101  101 - 111 mmol/L Final  . CO2 06/10/2016 25  22 - 32 mmol/L Final  . Glucose, Bld 06/10/2016 145* 65 - 99 mg/dL Final  . BUN 06/10/2016 12  6 - 20 mg/dL Final  . Creatinine, Ser 06/10/2016 0.89  0.61 - 1.24 mg/dL Final  . Calcium 06/10/2016 8.7* 8.9 - 10.3 mg/dL Final  . GFR calc non Af Amer 06/10/2016 >60  >60 mL/min Final  . GFR calc Af Amer 06/10/2016 >60  >60 mL/min Final   Comment: (NOTE) The eGFR has been calculated using the CKD EPI equation. This calculation has not been validated in all clinical situations. eGFR's persistently <60 mL/min signify possible Chronic Kidney Disease.   . Anion gap 06/10/2016 9  5 - 15 Final  . Glucose-Capillary 06/09/2016 176* 65 - 99 mg/dL Final  . Glucose-Capillary 06/10/2016 149* 65 - 99 mg/dL Final  . Glucose-Capillary 06/10/2016 142* 65 - 99 mg/dL Final  . WBC 06/11/2016 10.3  4.0 - 10.5 K/uL Final  . RBC 06/11/2016 3.86* 4.22 - 5.81 MIL/uL Final  . Hemoglobin 06/11/2016 11.9* 13.0 - 17.0 g/dL Final  . HCT 06/11/2016 34.8* 39.0 - 52.0 % Final  . MCV 06/11/2016 90.2  78.0 - 100.0 fL Final  . MCH 06/11/2016 30.8  26.0 - 34.0 pg Final  . MCHC 06/11/2016 34.2  30.0 - 36.0 g/dL Final  . RDW  06/11/2016 13.1  11.5 - 15.5 % Final  . Platelets 06/11/2016 135* 150 - 400 K/uL Final  . Glucose-Capillary 06/10/2016 171* 65 - 99 mg/dL Final  . Glucose-Capillary 06/10/2016 162* 65 - 99 mg/dL Final  . Glucose-Capillary 06/11/2016 145* 65 - 99 mg/dL Final  . Glucose-Capillary 06/11/2016 116* 65 - 99 mg/dL Final    RADIOGRAPHIC STUDIES: I have personally reviewed the radiological images as listed and agree with the findings in the report  No results found.  ASSESSMENT/PLAN Cancer Staging Leiomyosarcoma [facility]) Staging form: Primary Cutaneous B-Cell/T-Cell Lymphoma (Non-MF/SS Lymphoma), AJCC 8th Edition - Clinical stage from 01/16/2017: cT1a, cN0 - Signed by Earlie Server, MD on 01/16/2017  #Previously discussed with the pathologist Dr. Tamala Julian over the phone. 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 The margin is negative however very close.  Cutaneous leiomyosarcoma is a very rare type of cancer and current literature is limited for standardized management. I recommend wide excision, with 1 cm margin. In general cutaneous leiomyosarcoma as a good prognosis. Patient already has an appointment with Dr. Al Decant for additional resection. Subcutaneous leiomyosarcoma usually half worse prognosis. MRI of his lower right extremity showed no evidence of tumor extension into muscle or bone in the lower extremity. No popliteal adenopathy. Ideally a full staging with CT chest abdomen pelvis will be helpful to rule out distant metastasis. However this is not approved by patient's insurance. Given that he does not have any local evidence of a deeper soft tissue involvement and clinically he has no signs of distant metastasis, will hold additional work up at this point and monitor him closely.  Patient understands the plan and agreed to proceed. He has chances to ask questions and all questions  answered to his satisfaction.  F/u  in 3 months w labs.   Orders Placed This Encounter  Procedures  . CBC with Differential/Platelet    Standing Status:   Future    Standing Expiration Date:   02/04/2018  . Comprehensive metabolic panel    Standing Status:   Future    Standing Expiration Date:   02/04/2018    All questions were answered. The patient knows to call the clinic with any problems, questions or concerns. Thank you for this kind referral and the opportunity to participate in the care of this patient.  Total face to face encounter time for this patient visit was 25 min. >50% of the time was  spent in counseling and coordination of care.  Earlie Server MD PhD [facility] [phone] Phone: [phone] 01/12/2017    Earlie Server, MD  02/04/2017 10:36 AM

## 2017-02-04 NOTE — Progress Notes (Signed)
Patient here today for follow up.  Patient states no new concerns today  

## 2017-05-13 ENCOUNTER — Inpatient Hospital Stay: Payer: Managed Care, Other (non HMO) | Attending: Oncology

## 2017-05-13 ENCOUNTER — Inpatient Hospital Stay (HOSPITAL_BASED_OUTPATIENT_CLINIC_OR_DEPARTMENT_OTHER): Payer: Managed Care, Other (non HMO) | Admitting: Oncology

## 2017-05-13 ENCOUNTER — Other Ambulatory Visit: Payer: Self-pay

## 2017-05-13 VITALS — BP 129/84 | HR 60 | Temp 96.4°F | Resp 18 | Wt 277.8 lb

## 2017-05-13 DIAGNOSIS — M1990 Unspecified osteoarthritis, unspecified site: Secondary | ICD-10-CM | POA: Insufficient documentation

## 2017-05-13 DIAGNOSIS — C4921 Malignant neoplasm of connective and soft tissue of right lower limb, including hip: Secondary | ICD-10-CM | POA: Insufficient documentation

## 2017-05-13 DIAGNOSIS — I10 Essential (primary) hypertension: Secondary | ICD-10-CM | POA: Diagnosis not present

## 2017-05-13 DIAGNOSIS — C499 Malignant neoplasm of connective and soft tissue, unspecified: Secondary | ICD-10-CM

## 2017-05-13 DIAGNOSIS — F17290 Nicotine dependence, other tobacco product, uncomplicated: Secondary | ICD-10-CM | POA: Diagnosis not present

## 2017-05-13 DIAGNOSIS — E669 Obesity, unspecified: Secondary | ICD-10-CM | POA: Diagnosis not present

## 2017-05-13 DIAGNOSIS — Z79899 Other long term (current) drug therapy: Secondary | ICD-10-CM | POA: Diagnosis not present

## 2017-05-13 DIAGNOSIS — G4730 Sleep apnea, unspecified: Secondary | ICD-10-CM | POA: Diagnosis not present

## 2017-05-13 DIAGNOSIS — K219 Gastro-esophageal reflux disease without esophagitis: Secondary | ICD-10-CM | POA: Diagnosis not present

## 2017-05-13 DIAGNOSIS — R7303 Prediabetes: Secondary | ICD-10-CM | POA: Diagnosis not present

## 2017-05-13 LAB — CBC WITH DIFFERENTIAL/PLATELET
BASOS ABS: 0 10*3/uL (ref 0–0.1)
BASOS PCT: 1 %
EOS ABS: 0.1 10*3/uL (ref 0–0.7)
EOS PCT: 2 %
HCT: 44.5 % (ref 40.0–52.0)
HEMOGLOBIN: 15.3 g/dL (ref 13.0–18.0)
Lymphocytes Relative: 23 %
Lymphs Abs: 1.7 10*3/uL (ref 1.0–3.6)
MCH: 30.9 pg (ref 26.0–34.0)
MCHC: 34.5 g/dL (ref 32.0–36.0)
MCV: 89.6 fL (ref 80.0–100.0)
Monocytes Absolute: 0.5 10*3/uL (ref 0.2–1.0)
Monocytes Relative: 7 %
NEUTROS PCT: 67 %
Neutro Abs: 5 10*3/uL (ref 1.4–6.5)
PLATELETS: 180 10*3/uL (ref 150–440)
RBC: 4.96 MIL/uL (ref 4.40–5.90)
RDW: 13.6 % (ref 11.5–14.5)
WBC: 7.4 10*3/uL (ref 3.8–10.6)

## 2017-05-13 LAB — COMPREHENSIVE METABOLIC PANEL
ALBUMIN: 4 g/dL (ref 3.5–5.0)
ALT: 25 U/L (ref 17–63)
ANION GAP: 6 (ref 5–15)
AST: 28 U/L (ref 15–41)
Alkaline Phosphatase: 76 U/L (ref 38–126)
BUN: 20 mg/dL (ref 6–20)
CALCIUM: 9.3 mg/dL (ref 8.9–10.3)
CHLORIDE: 102 mmol/L (ref 101–111)
CO2: 29 mmol/L (ref 22–32)
CREATININE: 1 mg/dL (ref 0.61–1.24)
Glucose, Bld: 92 mg/dL (ref 65–99)
Potassium: 3.8 mmol/L (ref 3.5–5.1)
SODIUM: 137 mmol/L (ref 135–145)
Total Bilirubin: 0.7 mg/dL (ref 0.3–1.2)
Total Protein: 7.7 g/dL (ref 6.5–8.1)

## 2017-05-13 NOTE — Progress Notes (Signed)
[facility] Follow up Visit:  Patient Care Team: Juluis Pitch, MD as PCP - General (Family Medicine)  CHIEF COMPLAINTS  Follow-up visit for cutaneous leiomyosarcoma   HISTORY OF PRESENTING ILLNESS: Connor Brewer 56 y.o. male with past medical history presents to follow up of cutaneous leiomyosarcoma Patient reports that he went to the dermatologist for some skin issue and a whole-body rheumatology examination showed a lesion located at the right hip to distal pre-tibial area. Of note from Dr. Nehemiah Massed, biopsy showed leiomyosarcoma and patient had excision of this lesion on December 30 2016. Pathology showed residual leiomyosarcoma less than 0.1 mm from 3:00 black inked margin. Microscopic description in this larger reexcision, there is scar and biopsy site changes with residual vesicle all leiomyosarcoma. Notably the black inked margin is less than 0.1 mm from residual leiomyosarcoma but appears narrowly free on H&E, smallest muscle actin and desmin stains with a problem rate staining controls.   discussed with the pathologist Dr. Tamala Julian over the phone. 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 The margin is negative however very close.  MRI of lower right extremity showed no evidence of tumor extension into muscle or bone in the lower extremity. No popliteal adenopathy. Ideally a full staging with CT chest abdomen pelvis will be helpful to rule out distant metastasis. However this is not approved by patient's insurance.Given that he does not have any local evidence of a deeper soft tissue involvement and clinically he has no signs of distant metastasis, will hold additional work up at this point and monitor him closely.   Cutaneous leiomyosarcoma is a very rare type of cancer and current literature is limited for standardized management. I recommend wide  excision, with 1 cm margin. Recommendation of achieving 1cm margin was communicated with patient's dermatologist Dr. Nehemiah Massed.  .  In general cutaneous leiomyosarcoma as a good prognosis with rare distant metastasis rate. Patient had resection again with his dermatologist to achieve wider margin.   Today he reports feeling well. His lower extremity surgical wound is covered and per patient about to heal. He continues to follow up with dermatologist.  Denies any cough, chest pain, bone pain. Good energy level.   Review of Systems  Constitutional: Negative for appetite change.  HENT:   Negative for hearing loss.   Eyes: Negative for eye problems.  Respiratory: Negative for chest tightness.   Cardiovascular: Negative for chest pain.  Gastrointestinal: Negative for abdominal distention.  Endocrine: Negative for hot flashes.  Genitourinary: Negative for difficulty urinating.   Musculoskeletal: Negative for arthralgias.  Skin: Positive for wound. Negative for itching.  Neurological: Negative for dizziness.  Hematological: Negative for adenopathy.  Psychiatric/Behavioral: The patient is not nervous/anxious.     MEDICAL HISTORY: Past Medical History:  Diagnosis Date  . Bronchitis    " aalergy driven"  . DVT (deep venous thrombosis) (HCC)    LLE  . GERD (gastroesophageal reflux disease)   . H/O seasonal allergies   . Hypertension   . Obesity   . Osteoarthritis    right hip  . PONV (postoperative nausea and vomiting)   . Pre-diabetes   . Sleep apnea    wears CPAP    SURGICAL HISTORY: Past Surgical History:  Procedure Laterality Date  . BACK SURGERY     laser spine  . COLONOSCOPY W/ BIOPSIES AND POLYPECTOMY    . FRACTURE SURGERY     right  arm, left knee, right knee  . MULTIPLE TOOTH EXTRACTIONS      SOCIAL HISTORY: Social History   Socioeconomic History  . Marital status: Married    Spouse name: Not on file  . Number of children: Not on file  . Years of education: Not  on file  . Highest education level: Not on file  Social Needs  . Financial resource strain: Not on file  . Food insecurity - worry: Not on file  . Food insecurity - inability: Not on file  . Transportation needs - medical: Not on file  . Transportation needs - non-medical: Not on file  Occupational History  . Not on file  Tobacco Use  . Smoking status: Current Some Day Smoker    Types: Cigars  . Smokeless tobacco: Never Used  Substance and Sexual Activity  . Alcohol use: Yes    Comment: social  . Drug use: No  . Sexual activity: Not on file  Other Topics Concern  . Not on file  Social History Narrative  . Not on file    FAMILY HISTORY Family History  Problem Relation Age of Onset  . Lung cancer Father   . Cancer Brother     ALLERGIES:  is allergic to ivp dye [iodinated diagnostic agents] and metrizamide.  MEDICATIONS:  Current Outpatient Medications  Medication Sig Dispense Refill  . amLODipine (NORVASC) 5 MG tablet Take 5 mg by mouth daily.    Marland Kitchen atorvastatin (LIPITOR) 20 MG tablet Take 20 mg by mouth daily.    . fexofenadine (ALLEGRA) 180 MG tablet Take 180 mg by mouth daily. May take Xyzal occasionally in place of Allegra    . losartan-hydrochlorothiazide (HYZAAR) 50-12.5 MG tablet Take 1 tablet by mouth 2 (two) times daily.    Marland Kitchen MEGARED OMEGA-3 KRILL OIL 500 MG CAPS Take 1 capsule by mouth 2 (two) times daily.    . meloxicam (MOBIC) 15 MG tablet Take by mouth.    . metFORMIN (GLUCOPHAGE-XR) 750 MG 24 hr tablet Take 750 mg by mouth daily.  3  . methocarbamol (ROBAXIN) 500 MG tablet Take 1 tablet (500 mg total) by mouth 2 (two) times daily with a meal. 60 tablet 0  . Multiple Vitamins-Minerals (MULTIVITAMIN WITH MINERALS) tablet Take 1 tablet by mouth daily.    Marland Kitchen omeprazole (PRILOSEC) 20 MG capsule Take 20 mg by mouth daily.     No current facility-administered medications for this visit.     PHYSICAL EXAMINATION:  ECOG PERFORMANCE STATUS: 0 -  Asymptomatic   Vitals:   05/13/17 1415  BP: 129/84  Pulse: 60  Resp: 18  Temp: (!) 96.4 F (35.8 C)    Filed Weights   05/13/17 1415  Weight: 277 lb 12.8 oz (126 kg)     Physical Exam GENERAL: No distress, well nourished.  SKIN:  No rashes or significant lesions  HEAD: Normocephalic, No masses, lesions, tenderness or abnormalities  EYES: Conjunctiva are pink, non icteric ENT: External ears normal ,lips , buccal mucosa, and tongue normal and mucous membranes are moist  LYMPH: No palpable cervical and axillary lymphadenopathy  LUNGS: Clear to auscultation, no crackles or wheezes HEART: Regular rate & rhythm, no murmurs, no gallops, S1 normal and S2 normal  ABDOMEN: Abdomen soft, non-tender, normal bowel sounds, I did not appreciate any  masses or organomegaly  MUSCULOSKELETAL: No CVA tenderness and no tenderness on percussion of the back or rib cage.  EXTREMITIES: No edema, + right pretibia wound, covered with dressing.  NEURO:  Alert & oriented, no focal motor/sensory deficits.    LABORATORY DATA: I have personally reviewed the data as listed:  No visits with results within 1 Month(s) from this visit.  Latest known visit with results is:  Admission on 06/09/2016, Discharged on 06/11/2016  Component Date Value Ref Range Status  . Glucose-Capillary 06/09/2016 94  65 - 99 mg/dL Final  . Hgb A1c MFr Bld 06/09/2016 5.5  4.8 - 5.6 % Final   Comment: (NOTE)         Pre-diabetes: 5.7 - 6.4         Diabetes: >6.4         Glycemic control for adults with diabetes: <7.0   . Mean Plasma Glucose 06/09/2016 111  mg/dL Final   Comment: (NOTE) Performed At: [facility] [place], [place] [id number] Lindon Romp MD [id number]   . WBC 06/10/2016 9.5  4.0 - 10.5 K/uL Final  . RBC 06/10/2016 4.19* 4.22 - 5.81 MIL/uL Final  . Hemoglobin 06/10/2016 12.6* 13.0 - 17.0 g/dL Final  . HCT 06/10/2016 38.2* 39.0 - 52.0 % Final  . MCV 06/10/2016 91.2  78.0 -  100.0 fL Final  . MCH 06/10/2016 30.1  26.0 - 34.0 pg Final  . MCHC 06/10/2016 33.0  30.0 - 36.0 g/dL Final  . RDW 06/10/2016 13.5  11.5 - 15.5 % Final  . Platelets 06/10/2016 136* 150 - 400 K/uL Final  . Sodium 06/10/2016 135  135 - 145 mmol/L Final  . Potassium 06/10/2016 3.9  3.5 - 5.1 mmol/L Final  . Chloride 06/10/2016 101  101 - 111 mmol/L Final  . CO2 06/10/2016 25  22 - 32 mmol/L Final  . Glucose, Bld 06/10/2016 145* 65 - 99 mg/dL Final  . BUN 06/10/2016 12  6 - 20 mg/dL Final  . Creatinine, Ser 06/10/2016 0.89  0.61 - 1.24 mg/dL Final  . Calcium 06/10/2016 8.7* 8.9 - 10.3 mg/dL Final  . GFR calc non Af Amer 06/10/2016 >60  >60 mL/min Final  . GFR calc Af Amer 06/10/2016 >60  >60 mL/min Final   Comment: (NOTE) The eGFR has been calculated using the CKD EPI equation. This calculation has not been validated in all clinical situations. eGFR's persistently <60 mL/min signify possible Chronic Kidney Disease.   . Anion gap 06/10/2016 9  5 - 15 Final  . Glucose-Capillary 06/09/2016 176* 65 - 99 mg/dL Final  . Glucose-Capillary 06/10/2016 149* 65 - 99 mg/dL Final  . Glucose-Capillary 06/10/2016 142* 65 - 99 mg/dL Final  . WBC 06/11/2016 10.3  4.0 - 10.5 K/uL Final  . RBC 06/11/2016 3.86* 4.22 - 5.81 MIL/uL Final  . Hemoglobin 06/11/2016 11.9* 13.0 - 17.0 g/dL Final  . HCT 06/11/2016 34.8* 39.0 - 52.0 % Final  . MCV 06/11/2016 90.2  78.0 - 100.0 fL Final  . MCH 06/11/2016 30.8  26.0 - 34.0 pg Final  . MCHC 06/11/2016 34.2  30.0 - 36.0 g/dL Final  . RDW 06/11/2016 13.1  11.5 - 15.5 % Final  . Platelets 06/11/2016 135* 150 - 400 K/uL Final  . Glucose-Capillary 06/10/2016 171* 65 - 99 mg/dL Final  . Glucose-Capillary 06/10/2016 162* 65 - 99 mg/dL Final  . Glucose-Capillary 06/11/2016 145* 65 - 99 mg/dL Final  . Glucose-Capillary 06/11/2016 116* 65 - 99 mg/dL Final    RADIOGRAPHIC STUDIES: I have personally reviewed the radiological images as listed and agree with the findings in  the report  No results found.  ASSESSMENT/PLAN Cancer Staging  Leiomyosarcoma [facility]) Staging form: Primary Cutaneous B-Cell/T-Cell Lymphoma (Non-MF/SS Lymphoma), AJCC 8th Edition - Clinical stage from 01/16/2017: cT1a, cN0 - Signed by Earlie Server, MD on 01/16/2017  1. Leiomyosarcoma ([place])     cutaneous leiomyosarcoma usually have good prognosis with low distant metastasis rate. . Will hold CT scan at this point. Follow up clinically,  F/u  in 6 months w labs.    All questions were answered. The patient knows to call the clinic with any problems, questions or concerns. Thank you for this kind referral and the opportunity to participate in the care of this patient.  Total face to face encounter time for this patient visit was 25 min. >50% of the time was  spent in counseling and coordination of care.  Earlie Server MD PhD [facility] [phone] Phone: [phone] 01/12/2017    Earlie Server, MD  05/13/2017 1:45 PM

## 2017-05-13 NOTE — Progress Notes (Signed)
Here for follow up

## 2017-05-14 ENCOUNTER — Encounter: Payer: Self-pay | Admitting: Oncology

## 2017-11-11 ENCOUNTER — Ambulatory Visit: Payer: Managed Care, Other (non HMO) | Admitting: Oncology

## 2017-12-21 ENCOUNTER — Other Ambulatory Visit: Payer: Self-pay | Admitting: Sports Medicine

## 2017-12-21 DIAGNOSIS — M2392 Unspecified internal derangement of left knee: Secondary | ICD-10-CM

## 2017-12-21 DIAGNOSIS — M25562 Pain in left knee: Secondary | ICD-10-CM

## 2017-12-21 DIAGNOSIS — M25362 Other instability, left knee: Secondary | ICD-10-CM

## 2017-12-21 DIAGNOSIS — M25462 Effusion, left knee: Secondary | ICD-10-CM

## 2017-12-22 ENCOUNTER — Other Ambulatory Visit (HOSPITAL_COMMUNITY): Payer: Self-pay | Admitting: Sports Medicine

## 2017-12-22 DIAGNOSIS — M25562 Pain in left knee: Secondary | ICD-10-CM

## 2017-12-22 DIAGNOSIS — M25362 Other instability, left knee: Secondary | ICD-10-CM

## 2017-12-22 DIAGNOSIS — M2392 Unspecified internal derangement of left knee: Secondary | ICD-10-CM

## 2017-12-22 DIAGNOSIS — M25462 Effusion, left knee: Secondary | ICD-10-CM

## 2017-12-28 ENCOUNTER — Ambulatory Visit (HOSPITAL_COMMUNITY)
Admission: RE | Admit: 2017-12-28 | Discharge: 2017-12-28 | Disposition: A | Discharge status: Home / Self Care | Payer: Managed Care, Other (non HMO) | Source: Ambulatory Visit | Attending: Sports Medicine | Admitting: Sports Medicine

## 2017-12-28 DIAGNOSIS — S83512A Sprain of anterior cruciate ligament of left knee, initial encounter: Secondary | ICD-10-CM | POA: Diagnosis not present

## 2017-12-28 DIAGNOSIS — X58XXXA Exposure to other specified factors, initial encounter: Secondary | ICD-10-CM | POA: Diagnosis not present

## 2017-12-28 DIAGNOSIS — M25362 Other instability, left knee: Secondary | ICD-10-CM | POA: Insufficient documentation

## 2017-12-28 DIAGNOSIS — M25562 Pain in left knee: Secondary | ICD-10-CM | POA: Insufficient documentation

## 2017-12-28 DIAGNOSIS — M25462 Effusion, left knee: Secondary | ICD-10-CM | POA: Diagnosis not present

## 2017-12-28 DIAGNOSIS — M1712 Unilateral primary osteoarthritis, left knee: Secondary | ICD-10-CM | POA: Diagnosis not present

## 2017-12-28 DIAGNOSIS — M2392 Unspecified internal derangement of left knee: Secondary | ICD-10-CM

## 2018-01-24 DIAGNOSIS — M1712 Unilateral primary osteoarthritis, left knee: Secondary | ICD-10-CM | POA: Insufficient documentation

## 2018-02-10 ENCOUNTER — Other Ambulatory Visit: Payer: Self-pay

## 2018-02-10 ENCOUNTER — Encounter
Admission: RE | Admit: 2018-02-10 | Discharge: 2018-02-10 | Disposition: A | Discharge status: Home / Self Care | Payer: Managed Care, Other (non HMO) | Source: Ambulatory Visit | Attending: Orthopedic Surgery | Admitting: Orthopedic Surgery

## 2018-02-10 DIAGNOSIS — Z01812 Encounter for preprocedural laboratory examination: Secondary | ICD-10-CM | POA: Diagnosis present

## 2018-02-10 DIAGNOSIS — I10 Essential (primary) hypertension: Secondary | ICD-10-CM | POA: Diagnosis present

## 2018-02-10 DIAGNOSIS — Z01810 Encounter for preprocedural cardiovascular examination: Secondary | ICD-10-CM | POA: Diagnosis not present

## 2018-02-10 LAB — URINALYSIS, ROUTINE W REFLEX MICROSCOPIC
BILIRUBIN URINE: NEGATIVE
Glucose, UA: NEGATIVE mg/dL
Hgb urine dipstick: NEGATIVE
Ketones, ur: NEGATIVE mg/dL
Leukocytes, UA: NEGATIVE
NITRITE: NEGATIVE
PH: 6 (ref 5.0–8.0)
Protein, ur: NEGATIVE mg/dL
SPECIFIC GRAVITY, URINE: 1.019 (ref 1.005–1.030)

## 2018-02-10 LAB — COMPREHENSIVE METABOLIC PANEL
ALT: 22 U/L (ref 0–44)
ANION GAP: 7 (ref 5–15)
AST: 25 U/L (ref 15–41)
Albumin: 4.1 g/dL (ref 3.5–5.0)
Alkaline Phosphatase: 69 U/L (ref 38–126)
BILIRUBIN TOTAL: 0.8 mg/dL (ref 0.3–1.2)
BUN: 23 mg/dL — AB (ref 6–20)
CO2: 26 mmol/L (ref 22–32)
Calcium: 9.3 mg/dL (ref 8.9–10.3)
Chloride: 109 mmol/L (ref 98–111)
Creatinine, Ser: 1.13 mg/dL (ref 0.61–1.24)
Glucose, Bld: 122 mg/dL — ABNORMAL HIGH (ref 70–99)
POTASSIUM: 4.1 mmol/L (ref 3.5–5.1)
Sodium: 142 mmol/L (ref 135–145)
TOTAL PROTEIN: 7.5 g/dL (ref 6.5–8.1)

## 2018-02-10 LAB — CBC
HEMATOCRIT: 45 % (ref 40.0–52.0)
Hemoglobin: 15.5 g/dL (ref 13.0–18.0)
MCH: 30.8 pg (ref 26.0–34.0)
MCHC: 34.5 g/dL (ref 32.0–36.0)
MCV: 89.3 fL (ref 80.0–100.0)
Platelets: 162 10*3/uL (ref 150–440)
RBC: 5.04 MIL/uL (ref 4.40–5.90)
RDW: 15 % — AB (ref 11.5–14.5)
WBC: 7.1 10*3/uL (ref 3.8–10.6)

## 2018-02-10 LAB — APTT: aPTT: 28 seconds (ref 24–36)

## 2018-02-10 LAB — TYPE AND SCREEN
ABO/RH(D): B POS
ANTIBODY SCREEN: NEGATIVE

## 2018-02-10 LAB — PROTIME-INR
INR: 0.85
PROTHROMBIN TIME: 11.5 s (ref 11.4–15.2)

## 2018-02-10 LAB — HEMOGLOBIN A1C
HEMOGLOBIN A1C: 5.9 % — AB (ref 4.8–5.6)
Mean Plasma Glucose: 122.63 mg/dL

## 2018-02-10 LAB — SURGICAL PCR SCREEN
MRSA, PCR: NEGATIVE
Staphylococcus aureus: NEGATIVE

## 2018-02-10 LAB — SEDIMENTATION RATE: SED RATE: 13 mm/h (ref 0–20)

## 2018-02-10 LAB — C-REACTIVE PROTEIN: CRP: 1.2 mg/dL — AB (ref <1.0)

## 2018-02-10 MED ORDER — LACTATED RINGERS IV SOLN: INTRAVENOUS | Status: DC

## 2018-02-10 NOTE — Patient Instructions (Signed)
Your procedure is scheduled on : February 19, 2018 FRIDAY Report to Day Surgery on the 2nd floor of the [facility]. To find out your arrival time, please call [phone] between 1PM - 3PM on: Thursday February 18, 2018  REMEMBER: Instructions that are not followed completely may result in serious medical risk, up to and including death; or upon the discretion of your surgeon and anesthesiologist your surgery may need to be rescheduled.  Do not eat food after midnight the night before surgery.  No gum chewing, lozengers or hard candies.  You may however, drink CLEAR liquids up to 2 hours before you are scheduled to arrive for your surgery. Do not drink anything within 2 hours of the start of your surgery.  Clear liquids include: - water  Do NOT drink anything that is not on this list.  Type 1 and Type 2 diabetics should only drink water.  No Alcohol for 24 hours before or after surgery.  No Smoking including e-cigarettes for 24 hours prior to surgery.  No chewable tobacco products for at least 6 hours prior to surgery.  No nicotine patches on the day of surgery.  On the morning of surgery brush your teeth with toothpaste and water, you may rinse your mouth with mouthwash if you wish. Do not swallow any toothpaste or mouthwash.  Notify your doctor if there is any change in your medical condition (cold, fever, infection).  Do not wear jewelry, make-up, hairpins, clips or nail polish.  Do not wear lotions, powders, or perfumes. You may wear deodorant.  Do not shave 48 hours prior to surgery. Men may shave face and neck.  Contacts and dentures may not be worn into surgery.  Do not bring valuables to the hospital, including drivers license, insurance or credit cards.  [facility] is not responsible for any belongings or valuables.   TAKE THESE MEDICATIONS THE MORNING OF SURGERY: AMLODIPINE  ATORVASTATIN ALLEGRA OMEPRAZOLE TAKE A DOSE THE NIGHT BEFORE AND A DOSE THE DAY OF  SURGERY Use CHG Soap  as directed on instruction sheet.  Use inhalers on the day of surgery   Bring your C-PAP to the hospital with you in case you may have to spend the night.   Stop Metformin  2 days prior to surgery. LAST DOSE 02-16-2018   Follow recommendations from Cardiologist, Pulmonologist or PCP regarding stopping Aspirin- STOP NOW  Stop Anti-inflammatories (NSAIDS) such as Advil, Aleve, Ibuprofen, Motrin, Naproxen, Naprosyn and Aspirin based products such as Excedrin, Goodys Powder, BC Powder. STOP NOW  (May take Tylenol or Acetaminophen if needed.)  Stop ANY OVER THE COUNTER supplements until after surgery. OMEGA 3 (May continue Vitamin D, Vitamin B, and multivitamin.)  Wear comfortable clothing (specific to your surgery type) to the hospital.  Plan for stool softeners for home use.  If you are being admitted to the hospital overnight, leave your suitcase in the car. After surgery it may be brought to your room.  If you are being discharged the day of surgery, you will not be allowed to drive home. You will need a responsible adult to drive you home and stay with you that night.   If you are taking public transportation, you will need to have a responsible adult with you. Please confirm with your physician that it is acceptable to use public transportation.   Please call [phone] if you have any questions about these instructions.

## 2018-02-10 NOTE — Pre-Procedure Instructions (Signed)
hgbA1c and c reactive prot results faxed to Dr Clydell Hakim office

## 2018-02-10 NOTE — Pre-Procedure Instructions (Signed)
EKG COMPARED WITH 2017 

## 2018-02-11 LAB — URINE CULTURE
CULTURE: NO GROWTH
SPECIAL REQUESTS: NORMAL

## 2018-02-14 ENCOUNTER — Encounter: Payer: Self-pay | Admitting: Orthopedic Surgery

## 2018-02-14 NOTE — Discharge Instructions (Signed)
°  Instructions after Total Knee Replacement ° ° Arianis Bowditch P. Gedalia Mcmillon, Jr., M.D.    ° Dept. of Orthopaedics & Sports Medicine ° [facility] ° [address] ° [place],   [address] ° Phone: [phone]   Fax: [phone] ° °  °DIET: °• Drink plenty of non-alcoholic fluids. °• Resume your normal diet. Include foods high in fiber. ° °ACTIVITY:  °• You may use crutches or a walker with weight-bearing as tolerated, unless instructed otherwise. °• You may be weaned off of the walker or crutches by your Physical Therapist.  °• Do NOT place pillows under the knee. Anything placed under the knee could limit your ability to straighten the knee.   °• Continue doing gentle exercises. Exercising will reduce the pain and swelling, increase motion, and prevent muscle weakness.   °• Please continue to use the TED compression stockings for 6 weeks. You may remove the stockings at night, but should reapply them in the morning. °• Do not drive or operate any equipment until instructed. ° °WOUND CARE:  °• Continue to use the PolarCare or ice packs periodically to reduce pain and swelling. °• You may bathe or shower after the staples are removed at the first office visit following surgery. ° °MEDICATIONS: °• You may resume your regular medications. °• Please take the pain medication as prescribed on the medication. °• Do not take pain medication on an empty stomach. °• You have been given a prescription for a blood thinner (Lovenox or Coumadin). Please take the medication as instructed. (NOTE: After completing a 2 week course of Lovenox, take one Enteric-coated aspirin once a day. This along with elevation will help reduce the possibility of phlebitis in your operated leg.) °• Do not drive or drink alcoholic beverages when taking pain medications. ° °CALL THE OFFICE FOR: °• Temperature above 101 degrees °• Excessive bleeding or drainage on the dressing. °• Excessive swelling, coldness, or paleness of the toes. °• Persistent  nausea and vomiting. ° °FOLLOW-UP:  °• You should have an appointment to return to the office in 10-14 days after surgery. °• Arrangements have been made for continuation of Physical Therapy (either home therapy or outpatient therapy). °  °

## 2018-02-18 MED ORDER — TRANEXAMIC ACID 1000 MG/10ML IV SOLN
1000.0000 mg | INTRAVENOUS | Status: DC
  Filled 2018-02-18: qty 10

## 2018-02-18 MED ORDER — DEXTROSE 5 % IV SOLN
3.0000 g | INTRAVENOUS | Status: DC
  Filled 2018-02-18: qty 3000

## 2018-02-19 ENCOUNTER — Inpatient Hospital Stay: Payer: Managed Care, Other (non HMO) | Admitting: Anesthesiology

## 2018-02-19 ENCOUNTER — Encounter
Admission: RE | Disposition: A | Discharge status: Home Health | Payer: Self-pay | Source: Ambulatory Visit | Attending: Orthopedic Surgery

## 2018-02-19 ENCOUNTER — Inpatient Hospital Stay: Payer: Managed Care, Other (non HMO)

## 2018-02-19 ENCOUNTER — Other Ambulatory Visit: Payer: Self-pay

## 2018-02-19 ENCOUNTER — Encounter: Payer: Self-pay | Admitting: Orthopedic Surgery

## 2018-02-19 ENCOUNTER — Inpatient Hospital Stay
Admission: RE | Admit: 2018-02-19 | Discharge: 2018-02-21 | DRG: 470 | Disposition: A | Discharge status: Home Health | Payer: Managed Care, Other (non HMO) | Source: Ambulatory Visit | Attending: Orthopedic Surgery | Admitting: Orthopedic Surgery

## 2018-02-19 DIAGNOSIS — K219 Gastro-esophageal reflux disease without esophagitis: Secondary | ICD-10-CM | POA: Diagnosis present

## 2018-02-19 DIAGNOSIS — Z6838 Body mass index (BMI) 38.0-38.9, adult: Secondary | ICD-10-CM | POA: Diagnosis not present

## 2018-02-19 DIAGNOSIS — I10 Essential (primary) hypertension: Secondary | ICD-10-CM | POA: Diagnosis present

## 2018-02-19 DIAGNOSIS — J302 Other seasonal allergic rhinitis: Secondary | ICD-10-CM | POA: Diagnosis present

## 2018-02-19 DIAGNOSIS — E669 Obesity, unspecified: Secondary | ICD-10-CM | POA: Diagnosis present

## 2018-02-19 DIAGNOSIS — E119 Type 2 diabetes mellitus without complications: Secondary | ICD-10-CM | POA: Diagnosis present

## 2018-02-19 DIAGNOSIS — M1712 Unilateral primary osteoarthritis, left knee: Secondary | ICD-10-CM | POA: Diagnosis present

## 2018-02-19 DIAGNOSIS — Z96659 Presence of unspecified artificial knee joint: Secondary | ICD-10-CM

## 2018-02-19 DIAGNOSIS — Z86718 Personal history of other venous thrombosis and embolism: Secondary | ICD-10-CM

## 2018-02-19 DIAGNOSIS — G4730 Sleep apnea, unspecified: Secondary | ICD-10-CM | POA: Diagnosis present

## 2018-02-19 HISTORY — PX: KNEE ARTHROPLASTY: SHX992

## 2018-02-19 LAB — ABO/RH: ABO/RH(D): B POS

## 2018-02-19 LAB — GLUCOSE, CAPILLARY
Glucose-Capillary: 131 mg/dL — ABNORMAL HIGH (ref 70–99)
Glucose-Capillary: 156 mg/dL — ABNORMAL HIGH (ref 70–99)
Glucose-Capillary: 148 mg/dL — ABNORMAL HIGH (ref 70–99)
Glucose-Capillary: 143 mg/dL — ABNORMAL HIGH (ref 70–99)

## 2018-02-19 SURGERY — ARTHROPLASTY, KNEE, TOTAL, USING IMAGELESS COMPUTER-ASSISTED NAVIGATION
Anesthesia: Spinal | Site: Knee | Laterality: Left | Wound class: Clean

## 2018-02-19 MED ORDER — OXYCODONE HCL 5 MG PO TABS
5.0000 mg | ORAL_TABLET | ORAL | Status: DC | PRN
  Administered 2018-02-20: 5 mg via ORAL
  Filled 2018-02-19: qty 1

## 2018-02-19 MED ORDER — CELECOXIB 200 MG PO CAPS
400.0000 mg | ORAL_CAPSULE | Freq: Once | ORAL | Status: AC
  Administered 2018-02-19: 400 mg via ORAL

## 2018-02-19 MED ORDER — OXYCODONE HCL 5 MG PO TABS
10.0000 mg | ORAL_TABLET | ORAL | Status: DC | PRN
  Administered 2018-02-19 – 2018-02-21 (×7): 10 mg via ORAL
  Filled 2018-02-19 (×7): qty 2

## 2018-02-19 MED ORDER — BISACODYL 10 MG RE SUPP
10.0000 mg | Freq: Every day | RECTAL | Status: DC | PRN
  Administered 2018-02-21: 10 mg via RECTAL
  Filled 2018-02-19 (×2): qty 1

## 2018-02-19 MED ORDER — LIDOCAINE HCL (CARDIAC) PF 100 MG/5ML IV SOSY
PREFILLED_SYRINGE | INTRAVENOUS | Status: DC | PRN
  Administered 2018-02-19: 100 mg via INTRAVENOUS

## 2018-02-19 MED ORDER — KETAMINE HCL 50 MG/ML IJ SOLN
INTRAMUSCULAR | Status: DC | PRN
  Administered 2018-02-19: 50 mg via INTRAMUSCULAR

## 2018-02-19 MED ORDER — SODIUM CHLORIDE 0.9 % IV SOLN
INTRAVENOUS | Status: DC
  Administered 2018-02-19: 50 mL/h via INTRAVENOUS

## 2018-02-19 MED ORDER — BUPIVACAINE HCL (PF) 0.25 % IJ SOLN
INTRAMUSCULAR | Status: AC
  Filled 2018-02-19: qty 30

## 2018-02-19 MED ORDER — ATORVASTATIN CALCIUM 20 MG PO TABS
20.0000 mg | ORAL_TABLET | Freq: Every day | ORAL | Status: DC
  Administered 2018-02-20 – 2018-02-21 (×2): 20 mg via ORAL
  Filled 2018-02-19 (×2): qty 1

## 2018-02-19 MED ORDER — DIPHENHYDRAMINE HCL 12.5 MG/5ML PO ELIX: 12.5000 mg | ORAL_SOLUTION | ORAL | Status: DC | PRN

## 2018-02-19 MED ORDER — LORATADINE 10 MG PO TABS
10.0000 mg | ORAL_TABLET | Freq: Every day | ORAL | Status: DC
  Administered 2018-02-20 – 2018-02-21 (×2): 10 mg via ORAL
  Filled 2018-02-19 (×2): qty 1

## 2018-02-19 MED ORDER — PANTOPRAZOLE SODIUM 40 MG PO TBEC
40.0000 mg | DELAYED_RELEASE_TABLET | Freq: Two times a day (BID) | ORAL | Status: DC
  Administered 2018-02-19 – 2018-02-21 (×5): 40 mg via ORAL
  Filled 2018-02-19 (×5): qty 1

## 2018-02-19 MED ORDER — FENTANYL CITRATE (PF) 100 MCG/2ML IJ SOLN
INTRAMUSCULAR | Status: AC
  Administered 2018-02-19: 25 ug via INTRAVENOUS
  Filled 2018-02-19: qty 2

## 2018-02-19 MED ORDER — SODIUM CHLORIDE 0.9 % IJ SOLN
INTRAMUSCULAR | Status: AC
  Filled 2018-02-19: qty 20

## 2018-02-19 MED ORDER — DEXTROSE 5 % IV SOLN
INTRAVENOUS | Status: DC | PRN
  Administered 2018-02-19: 3 g via INTRAVENOUS

## 2018-02-19 MED ORDER — ACETAMINOPHEN 10 MG/ML IV SOLN
INTRAVENOUS | Status: DC | PRN
  Administered 2018-02-19: 1000 mg via INTRAVENOUS

## 2018-02-19 MED ORDER — PROPOFOL 500 MG/50ML IV EMUL
INTRAVENOUS | Status: AC
  Filled 2018-02-19: qty 50

## 2018-02-19 MED ORDER — METOCLOPRAMIDE HCL 5 MG/ML IJ SOLN: 5.0000 mg | Freq: Three times a day (TID) | INTRAMUSCULAR | Status: DC | PRN

## 2018-02-19 MED ORDER — BUPIVACAINE LIPOSOME 1.3 % IJ SUSP
INTRAMUSCULAR | Status: AC
  Filled 2018-02-19: qty 20

## 2018-02-19 MED ORDER — THROMBIN 5000 UNITS EX SOLR
CUTANEOUS | Status: AC
  Filled 2018-02-19: qty 5000

## 2018-02-19 MED ORDER — EVICEL 5 ML EX KIT
PACK | CUTANEOUS | Status: AC
  Filled 2018-02-19: qty 1

## 2018-02-19 MED ORDER — ALUM & MAG HYDROXIDE-SIMETH 200-200-20 MG/5ML PO SUSP: 30.0000 mL | ORAL | Status: DC | PRN

## 2018-02-19 MED ORDER — PHENOL 1.4 % MT LIQD
1.0000 | OROMUCOSAL | Status: DC | PRN
  Filled 2018-02-19: qty 177

## 2018-02-19 MED ORDER — LOSARTAN POTASSIUM 50 MG PO TABS
100.0000 mg | ORAL_TABLET | Freq: Every day | ORAL | Status: DC
  Administered 2018-02-19: 100 mg via ORAL
  Filled 2018-02-19: qty 2

## 2018-02-19 MED ORDER — TRANEXAMIC ACID 1000 MG/10ML IV SOLN
1000.0000 mg | Freq: Once | INTRAVENOUS | Status: AC
  Administered 2018-02-19: 1000 mg via INTRAVENOUS
  Filled 2018-02-19: qty 1000

## 2018-02-19 MED ORDER — ONDANSETRON HCL 4 MG/2ML IJ SOLN
INTRAMUSCULAR | Status: DC | PRN
  Administered 2018-02-19: 4 mg via INTRAVENOUS

## 2018-02-19 MED ORDER — ONDANSETRON HCL 4 MG/2ML IJ SOLN: 4.0000 mg | Freq: Four times a day (QID) | INTRAMUSCULAR | Status: DC | PRN

## 2018-02-19 MED ORDER — SCOPOLAMINE 1 MG/3DAYS TD PT72
MEDICATED_PATCH | TRANSDERMAL | Status: AC
  Administered 2018-02-19: 1.5 mg via TRANSDERMAL
  Filled 2018-02-19: qty 1

## 2018-02-19 MED ORDER — TRAMADOL HCL 50 MG PO TABS
50.0000 mg | ORAL_TABLET | ORAL | Status: DC | PRN
  Administered 2018-02-19 – 2018-02-20 (×6): 100 mg via ORAL
  Filled 2018-02-19 (×7): qty 2

## 2018-02-19 MED ORDER — BUPIVACAINE HCL (PF) 0.5 % IJ SOLN
INTRAMUSCULAR | Status: AC
  Filled 2018-02-19: qty 10

## 2018-02-19 MED ORDER — SODIUM CHLORIDE 0.9 % IV SOLN
INTRAVENOUS | Status: DC | PRN
  Administered 2018-02-19: 60 mL

## 2018-02-19 MED ORDER — MAGNESIUM HYDROXIDE 400 MG/5ML PO SUSP
30.0000 mL | Freq: Every day | ORAL | Status: DC
  Administered 2018-02-19 – 2018-02-21 (×3): 30 mL via ORAL
  Filled 2018-02-19 (×3): qty 30

## 2018-02-19 MED ORDER — ACETAMINOPHEN 325 MG PO TABS: 325.0000 mg | ORAL_TABLET | Freq: Four times a day (QID) | ORAL | Status: DC | PRN

## 2018-02-19 MED ORDER — EVICEL 5 ML EX KIT
PACK | CUTANEOUS | Status: DC | PRN
  Administered 2018-02-19: 5 mL

## 2018-02-19 MED ORDER — METOCLOPRAMIDE HCL 10 MG PO TABS
10.0000 mg | ORAL_TABLET | Freq: Three times a day (TID) | ORAL | Status: DC
  Administered 2018-02-19 – 2018-02-21 (×7): 10 mg via ORAL
  Filled 2018-02-19 (×7): qty 1

## 2018-02-19 MED ORDER — METOCLOPRAMIDE HCL 10 MG PO TABS: 5.0000 mg | ORAL_TABLET | Freq: Three times a day (TID) | ORAL | Status: DC | PRN

## 2018-02-19 MED ORDER — ALBUTEROL SULFATE (2.5 MG/3ML) 0.083% IN NEBU: 2.5000 mg | INHALATION_SOLUTION | Freq: Four times a day (QID) | RESPIRATORY_TRACT | Status: DC | PRN

## 2018-02-19 MED ORDER — FENTANYL CITRATE (PF) 100 MCG/2ML IJ SOLN
25.0000 ug | INTRAMUSCULAR | Status: DC | PRN
  Administered 2018-02-19 (×4): 25 ug via INTRAVENOUS

## 2018-02-19 MED ORDER — PROPOFOL 500 MG/50ML IV EMUL
INTRAVENOUS | Status: DC | PRN
  Administered 2018-02-19: 50 ug/kg/min via INTRAVENOUS

## 2018-02-19 MED ORDER — ONDANSETRON HCL 4 MG/2ML IJ SOLN
INTRAMUSCULAR | Status: AC
  Filled 2018-02-19: qty 2

## 2018-02-19 MED ORDER — BUPIVACAINE HCL (PF) 0.5 % IJ SOLN
INTRAMUSCULAR | Status: DC | PRN
  Administered 2018-02-19: 3 mL

## 2018-02-19 MED ORDER — SODIUM CHLORIDE 0.9 % IV SOLN
INTRAVENOUS | Status: DC
  Administered 2018-02-19 (×2): via INTRAVENOUS

## 2018-02-19 MED ORDER — SENNOSIDES-DOCUSATE SODIUM 8.6-50 MG PO TABS
1.0000 | ORAL_TABLET | Freq: Two times a day (BID) | ORAL | Status: DC
  Administered 2018-02-19 – 2018-02-21 (×5): 1 via ORAL
  Filled 2018-02-19 (×5): qty 1

## 2018-02-19 MED ORDER — PROPOFOL 10 MG/ML IV BOLUS
INTRAVENOUS | Status: DC | PRN
  Administered 2018-02-19: 18 mg via INTRAVENOUS

## 2018-02-19 MED ORDER — KETAMINE HCL 50 MG/ML IJ SOLN
INTRAMUSCULAR | Status: AC
  Filled 2018-02-19: qty 10

## 2018-02-19 MED ORDER — ACETAMINOPHEN 10 MG/ML IV SOLN
1000.0000 mg | Freq: Four times a day (QID) | INTRAVENOUS | Status: AC
  Administered 2018-02-19 – 2018-02-20 (×4): 1000 mg via INTRAVENOUS
  Filled 2018-02-19 (×4): qty 100

## 2018-02-19 MED ORDER — BUPIVACAINE HCL (PF) 0.25 % IJ SOLN
INTRAMUSCULAR | Status: DC | PRN
  Administered 2018-02-19: 60 mL

## 2018-02-19 MED ORDER — CEFAZOLIN SODIUM-DEXTROSE 2-3 GM-%(50ML) IV SOLR
INTRAVENOUS | Status: DC | PRN
  Administered 2018-02-19: 2 g via INTRAVENOUS

## 2018-02-19 MED ORDER — HYDROCHLOROTHIAZIDE 25 MG PO TABS
25.0000 mg | ORAL_TABLET | Freq: Every day | ORAL | Status: DC
  Administered 2018-02-19: 25 mg via ORAL
  Filled 2018-02-19: qty 1

## 2018-02-19 MED ORDER — AMLODIPINE BESYLATE 10 MG PO TABS: 10.0000 mg | ORAL_TABLET | Freq: Every day | ORAL | Status: DC

## 2018-02-19 MED ORDER — ACETAMINOPHEN 10 MG/ML IV SOLN
INTRAVENOUS | Status: AC
  Filled 2018-02-19: qty 100

## 2018-02-19 MED ORDER — ACETAMINOPHEN 10 MG/ML IV SOLN
1000.0000 mg | Freq: Four times a day (QID) | INTRAVENOUS | Status: DC
  Filled 2018-02-19 (×2): qty 100

## 2018-02-19 MED ORDER — NEOMYCIN-POLYMYXIN B GU 40-200000 IR SOLN
Status: AC
  Filled 2018-02-19: qty 20

## 2018-02-19 MED ORDER — CEFAZOLIN SODIUM-DEXTROSE 2-4 GM/100ML-% IV SOLN
2.0000 g | Freq: Four times a day (QID) | INTRAVENOUS | Status: AC
  Administered 2018-02-19 – 2018-02-20 (×4): 2 g via INTRAVENOUS
  Filled 2018-02-19 (×4): qty 100

## 2018-02-19 MED ORDER — MIDAZOLAM HCL 2 MG/2ML IJ SOLN
INTRAMUSCULAR | Status: AC
  Filled 2018-02-19: qty 2

## 2018-02-19 MED ORDER — INSULIN ASPART 100 UNIT/ML ~~LOC~~ SOLN
0.0000 [IU] | Freq: Three times a day (TID) | SUBCUTANEOUS | Status: DC
  Administered 2018-02-19 – 2018-02-20 (×3): 2 [IU] via SUBCUTANEOUS
  Filled 2018-02-19 (×3): qty 1

## 2018-02-19 MED ORDER — CHLORHEXIDINE GLUCONATE 4 % EX LIQD: 60.0000 mL | Freq: Once | CUTANEOUS | Status: DC

## 2018-02-19 MED ORDER — DEXAMETHASONE SODIUM PHOSPHATE 10 MG/ML IJ SOLN
INTRAMUSCULAR | Status: AC
  Administered 2018-02-19: 8 mg via INTRAVENOUS
  Filled 2018-02-19: qty 1

## 2018-02-19 MED ORDER — LIDOCAINE HCL (PF) 2 % IJ SOLN
INTRAMUSCULAR | Status: AC
  Filled 2018-02-19: qty 10

## 2018-02-19 MED ORDER — CEFAZOLIN SODIUM 1 G IJ SOLR
INTRAMUSCULAR | Status: AC
  Filled 2018-02-19: qty 30

## 2018-02-19 MED ORDER — DEXAMETHASONE SODIUM PHOSPHATE 10 MG/ML IJ SOLN
8.0000 mg | Freq: Once | INTRAMUSCULAR | Status: AC
  Administered 2018-02-19: 8 mg via INTRAVENOUS

## 2018-02-19 MED ORDER — TRANEXAMIC ACID 1000 MG/10ML IV SOLN
INTRAVENOUS | Status: DC | PRN
  Administered 2018-02-19: 1000 mg via INTRAVENOUS

## 2018-02-19 MED ORDER — NEOMYCIN-POLYMYXIN B GU 40-200000 IR SOLN
Status: DC | PRN
  Administered 2018-02-19: 16 mL

## 2018-02-19 MED ORDER — HYDROMORPHONE HCL 1 MG/ML IJ SOLN: 0.5000 mg | INTRAMUSCULAR | Status: DC | PRN

## 2018-02-19 MED ORDER — GABAPENTIN 300 MG PO CAPS
300.0000 mg | ORAL_CAPSULE | Freq: Every day | ORAL | Status: DC
  Administered 2018-02-19 – 2018-02-20 (×2): 300 mg via ORAL
  Filled 2018-02-19 (×2): qty 1

## 2018-02-19 MED ORDER — FERROUS SULFATE 325 (65 FE) MG PO TABS
325.0000 mg | ORAL_TABLET | Freq: Two times a day (BID) | ORAL | Status: DC
  Administered 2018-02-19 – 2018-02-21 (×4): 325 mg via ORAL
  Filled 2018-02-19 (×4): qty 1

## 2018-02-19 MED ORDER — CELECOXIB 200 MG PO CAPS
200.0000 mg | ORAL_CAPSULE | Freq: Two times a day (BID) | ORAL | Status: DC
  Administered 2018-02-19 – 2018-02-21 (×4): 200 mg via ORAL
  Filled 2018-02-19 (×4): qty 1

## 2018-02-19 MED ORDER — ONDANSETRON HCL 4 MG/2ML IJ SOLN: 4.0000 mg | Freq: Once | INTRAMUSCULAR | Status: DC | PRN

## 2018-02-19 MED ORDER — CELECOXIB 200 MG PO CAPS
ORAL_CAPSULE | ORAL | Status: AC
  Administered 2018-02-19: 400 mg via ORAL
  Filled 2018-02-19: qty 2

## 2018-02-19 MED ORDER — GABAPENTIN 300 MG PO CAPS
ORAL_CAPSULE | ORAL | Status: AC
  Administered 2018-02-19: 300 mg via ORAL
  Filled 2018-02-19: qty 1

## 2018-02-19 MED ORDER — ALBUTEROL SULFATE HFA 108 (90 BASE) MCG/ACT IN AERS: 2.0000 | INHALATION_SPRAY | Freq: Four times a day (QID) | RESPIRATORY_TRACT | Status: DC | PRN

## 2018-02-19 MED ORDER — OMEGA-3-ACID ETHYL ESTERS 1 G PO CAPS
1.0000 g | ORAL_CAPSULE | Freq: Every day | ORAL | Status: DC
  Administered 2018-02-19: 1 g via ORAL
  Filled 2018-02-19: qty 1

## 2018-02-19 MED ORDER — PNEUMOCOCCAL VAC POLYVALENT 25 MCG/0.5ML IJ INJ: 0.5000 mL | INJECTION | INTRAMUSCULAR | Status: DC

## 2018-02-19 MED ORDER — METFORMIN HCL ER 750 MG PO TB24
750.0000 mg | ORAL_TABLET | Freq: Every day | ORAL | Status: DC
  Administered 2018-02-20 – 2018-02-21 (×2): 750 mg via ORAL
  Filled 2018-02-19 (×2): qty 1

## 2018-02-19 MED ORDER — LOSARTAN POTASSIUM-HCTZ 100-25 MG PO TABS: 1.0000 | ORAL_TABLET | Freq: Every day | ORAL | Status: DC

## 2018-02-19 MED ORDER — FLEET ENEMA 7-19 GM/118ML RE ENEM: 1.0000 | ENEMA | Freq: Once | RECTAL | Status: DC | PRN

## 2018-02-19 MED ORDER — ONDANSETRON HCL 4 MG PO TABS: 4.0000 mg | ORAL_TABLET | Freq: Four times a day (QID) | ORAL | Status: DC | PRN

## 2018-02-19 MED ORDER — MIDAZOLAM HCL 5 MG/5ML IJ SOLN
INTRAMUSCULAR | Status: DC | PRN
  Administered 2018-02-19: 2 mg via INTRAVENOUS

## 2018-02-19 MED ORDER — SCOPOLAMINE 1 MG/3DAYS TD PT72
1.0000 | MEDICATED_PATCH | TRANSDERMAL | Status: DC
  Administered 2018-02-19: 1.5 mg via TRANSDERMAL

## 2018-02-19 MED ORDER — ENOXAPARIN SODIUM 30 MG/0.3ML ~~LOC~~ SOLN
30.0000 mg | Freq: Two times a day (BID) | SUBCUTANEOUS | Status: DC
  Administered 2018-02-20 – 2018-02-21 (×3): 30 mg via SUBCUTANEOUS
  Filled 2018-02-19 (×3): qty 0.3

## 2018-02-19 MED ORDER — GABAPENTIN 300 MG PO CAPS
300.0000 mg | ORAL_CAPSULE | Freq: Once | ORAL | Status: AC
  Administered 2018-02-19: 300 mg via ORAL

## 2018-02-19 MED ORDER — MENTHOL 3 MG MT LOZG
1.0000 | LOZENGE | OROMUCOSAL | Status: DC | PRN
  Filled 2018-02-19: qty 9

## 2018-02-19 MED ORDER — ADULT MULTIVITAMIN W/MINERALS CH
1.0000 | ORAL_TABLET | Freq: Every day | ORAL | Status: DC
  Administered 2018-02-19 – 2018-02-21 (×3): 1 via ORAL
  Filled 2018-02-19 (×3): qty 1

## 2018-02-19 MED ORDER — SODIUM CHLORIDE 0.9 % IJ SOLN
INTRAMUSCULAR | Status: AC
  Filled 2018-02-19: qty 50

## 2018-02-19 SURGICAL SUPPLY — 77 items
BATTERY INSTRU NAVIGATION (MISCELLANEOUS) ×12 IMPLANT
BLADE SAW 70X12.5 (BLADE) ×3 IMPLANT
BLADE SAW 90X13X1.19 OSCILLAT (BLADE) ×3 IMPLANT
BLADE SAW 90X25X1.19 OSCILLAT (BLADE) ×3 IMPLANT
BONE CEMENT GENTAMICIN (Cement) ×6 IMPLANT
BTRY SRG DRVR LF (MISCELLANEOUS) ×4
CANISTER SUCT 1200ML W/VALVE (MISCELLANEOUS) ×3 IMPLANT
CANISTER SUCT 3000ML PPV (MISCELLANEOUS) ×6 IMPLANT
CEMENT BONE GENTAMICIN 40 (Cement) IMPLANT
CEMENT TIBIA MBT SIZE 5 (Knees) IMPLANT
COOLER POLAR GLACIER W/PUMP (MISCELLANEOUS) ×3 IMPLANT
CUFF TOURN 24 STER (MISCELLANEOUS) IMPLANT
CUFF TOURN 30 STER DUAL PORT (MISCELLANEOUS) ×2 IMPLANT
DRAPE SHEET LG 3/4 BI-LAMINATE (DRAPES) ×3 IMPLANT
DRESSING SURGICEL FIBRLLR 1X2 (HEMOSTASIS) IMPLANT
DRSG DERMACEA 8X12 NADH (GAUZE/BANDAGES/DRESSINGS) ×3 IMPLANT
DRSG OPSITE POSTOP 4X14 (GAUZE/BANDAGES/DRESSINGS) ×3 IMPLANT
DRSG SURGICEL FIBRILLAR 1X2 (HEMOSTASIS) ×3
DRSG TEGADERM 4X4.75 (GAUZE/BANDAGES/DRESSINGS) ×3 IMPLANT
DURAPREP 26ML APPLICATOR (WOUND CARE) ×6 IMPLANT
ELECT CAUTERY BLADE 6.4 (BLADE) ×3 IMPLANT
ELECT REM PT RETURN 9FT ADLT (ELECTROSURGICAL) ×3
ELECTRODE REM PT RTRN 9FT ADLT (ELECTROSURGICAL) ×1 IMPLANT
EX-PIN ORTHOLOCK NAV 4X150 (PIN) ×6 IMPLANT
FEMUR SIGMA PS SZ 5.0 L (Femur) ×2 IMPLANT
GLOVE BIOGEL M STRL SZ7.5 (GLOVE) ×6 IMPLANT
GLOVE BIOGEL PI IND STRL 7.5 (GLOVE) IMPLANT
GLOVE BIOGEL PI IND STRL 9 (GLOVE) ×1 IMPLANT
GLOVE BIOGEL PI INDICATOR 7.5 (GLOVE) ×6
GLOVE BIOGEL PI INDICATOR 9 (GLOVE) ×2
GLOVE INDICATOR 8.0 STRL GRN (GLOVE) ×3 IMPLANT
GLOVE SURG SYN 9.0  PF PI (GLOVE) ×2
GLOVE SURG SYN 9.0 PF PI (GLOVE) ×1 IMPLANT
GOWN STRL REUS W/ TWL LRG LVL3 (GOWN DISPOSABLE) ×2 IMPLANT
GOWN STRL REUS W/TWL 2XL LVL3 (GOWN DISPOSABLE) ×3 IMPLANT
GOWN STRL REUS W/TWL LRG LVL3 (GOWN DISPOSABLE) ×6
HEMOSTAT SURGICEL 2X3 (HEMOSTASIS) ×2 IMPLANT
HEMOVAC 400CC 10FR (MISCELLANEOUS) ×3 IMPLANT
HOLDER FOLEY CATH W/STRAP (MISCELLANEOUS) ×3 IMPLANT
HOOD PEEL AWAY FLYTE STAYCOOL (MISCELLANEOUS) ×6 IMPLANT
KIT TURNOVER KIT A (KITS) ×3 IMPLANT
KNIFE SCULPS 14X20 (INSTRUMENTS) ×3 IMPLANT
LABEL OR SOLS (LABEL) ×3 IMPLANT
NDL SAFETY ECLIPSE 18X1.5 (NEEDLE) ×1 IMPLANT
NDL SPNL 20GX3.5 QUINCKE YW (NEEDLE) ×2 IMPLANT
NEEDLE HYPO 18GX1.5 SHARP (NEEDLE) ×3
NEEDLE SPNL 20GX3.5 QUINCKE YW (NEEDLE) ×6 IMPLANT
NS IRRIG 500ML POUR BTL (IV SOLUTION) ×3 IMPLANT
PACK TOTAL KNEE (MISCELLANEOUS) ×3 IMPLANT
PAD WRAPON POLAR KNEE (MISCELLANEOUS) ×1 IMPLANT
PATELLA DOME PFC 38MM (Knees) ×2 IMPLANT
PIN DRILL QUICK PACK ×3 IMPLANT
PIN FIXATION 1/8DIA X 3INL (PIN) ×9 IMPLANT
PLATE ROT INSERT 10MM SIZE 5 (Plate) ×2 IMPLANT
PULSAVAC PLUS IRRIG FAN TIP (DISPOSABLE) ×3
SOL .9 NS 3000ML IRR  AL (IV SOLUTION) ×2
SOL .9 NS 3000ML IRR AL (IV SOLUTION) ×1
SOL .9 NS 3000ML IRR UROMATIC (IV SOLUTION) ×1 IMPLANT
SOL PREP PVP 2OZ (MISCELLANEOUS) ×3
SOLUTION PREP PVP 2OZ (MISCELLANEOUS) ×1 IMPLANT
SPONGE DRAIN TRACH 4X4 STRL 2S (GAUZE/BANDAGES/DRESSINGS) ×3 IMPLANT
SPONGE LAP 18X18 RF (DISPOSABLE) ×4 IMPLANT
STAPLER SKIN PROX 35W (STAPLE) ×3 IMPLANT
STRAP TIBIA SHORT (MISCELLANEOUS) ×3 IMPLANT
SUCTION FRAZIER HANDLE 10FR (MISCELLANEOUS) ×4
SUCTION TUBE FRAZIER 10FR DISP (MISCELLANEOUS) ×1 IMPLANT
SUT VIC AB 0 CT1 36 (SUTURE) ×3 IMPLANT
SUT VIC AB 1 CT1 36 (SUTURE) ×6 IMPLANT
SUT VIC AB 2-0 CT2 27 (SUTURE) ×3 IMPLANT
SYR 20CC LL (SYRINGE) ×3 IMPLANT
SYR 30ML LL (SYRINGE) ×6 IMPLANT
TIBIA MBT CEMENT SIZE 5 (Knees) ×3 IMPLANT
TIP FAN IRRIG PULSAVAC PLUS (DISPOSABLE) ×1 IMPLANT
TOWEL OR 17X26 4PK STRL BLUE (TOWEL DISPOSABLE) ×3 IMPLANT
TOWER CARTRIDGE SMART MIX (DISPOSABLE) ×3 IMPLANT
TRAY FOLEY MTR SLVR 16FR STAT (SET/KITS/TRAYS/PACK) ×3 IMPLANT
WRAPON POLAR PAD KNEE (MISCELLANEOUS) ×3

## 2018-02-19 NOTE — Op Note (Signed)
OPERATIVE NOTE  DATE OF SURGERY:  02/19/2018  PATIENT NAME:  Connor Brewer   DOB: 10-09-1960  MRN: [id number]  PRE-OPERATIVE DIAGNOSIS: Degenerative arthrosis of the left knee, primary  POST-OPERATIVE DIAGNOSIS:  Same  PROCEDURE:  Left total knee arthroplasty using computer-assisted navigation  SURGEON:  Marciano Sequin. M.D.  ASSISTANT:  Vance Peper, PA (present and scrubbed throughout the case, critical for assistance with exposure, retraction, instrumentation, and closure)  ANESTHESIA: spinal  ESTIMATED BLOOD LOSS: 150 mL  FLUIDS REPLACED: 800 mL of crystalloid  TOURNIQUET TIME: 111 minutes  DRAINS: 2 medium Hemovac drains  SOFT TISSUE RELEASES: Anterior cruciate ligament, posterior cruciate ligament, deep medial collateral ligament, patellofemoral ligament, and posterolateral corner  IMPLANTS UTILIZED: DePuy PFC Sigma size 5 posterior stabilized femoral component (cemented), size 5 MBT tibial component (cemented), 38 mm 3 peg oval dome patella (cemented), and a 10 mm stabilized rotating platform polyethylene insert.  INDICATIONS FOR SURGERY: Connor Brewer is a 57 y.o. year old male with a long history of progressive knee pain. X-rays demonstrated severe degenerative changes in tricompartmental fashion. The patient had not seen any significant improvement despite conservative nonsurgical intervention. After discussion of the risks and benefits of surgical intervention, the patient expressed understanding of the risks benefits and agree with plans for total knee arthroplasty.   The risks, benefits, and alternatives were discussed at length including but not limited to the risks of infection, bleeding, nerve injury, stiffness, blood clots, the need for revision surgery, cardiopulmonary complications, among others, and they were willing to proceed.  PROCEDURE IN DETAIL: The patient was brought into the operating room and, after adequate spinal anesthesia was achieved, a tourniquet was  placed on the patient's upper thigh. The patient's knee and leg were cleaned and prepped with alcohol and DuraPrep and draped in the usual sterile fashion. A "timeout" was performed as per usual protocol. The lower extremity was exsanguinated using an Esmarch, and the tourniquet was inflated to 300 mmHg. An anterior longitudinal incision was made followed by a standard mid vastus approach. The deep fibers of the medial collateral ligament were elevated in a subperiosteal fashion off of the medial flare of the tibia so as to maintain a continuous soft tissue sleeve. The patella was subluxed laterally and the patellofemoral ligament was incised. Inspection of the knee demonstrated severe degenerative changes with full-thickness loss of articular cartilage. Osteophytes were debrided using a rongeur. Anterior and posterior cruciate ligaments were excised. Two 4.0 mm Schanz pins were inserted in the femur and into the tibia for attachment of the array of trackers used for computer-assisted navigation. Hip center was identified using a circumduction technique. Distal landmarks were mapped using the computer. The distal femur and proximal tibia were mapped using the computer. The distal femoral cutting guide was positioned using computer-assisted navigation so as to achieve a 5 distal valgus cut. The femur was sized and it was felt that a size 5 femoral component was appropriate. A size 5 femoral cutting guide was positioned and the anterior cut was performed and verified using the computer. This was followed by completion of the posterior and chamfer cuts. Femoral cutting guide for the central box was then positioned in the center box cut was performed.  Attention was then directed to the proximal tibia. Medial and lateral menisci were excised. The extramedullary tibial cutting guide was positioned using computer-assisted navigation so as to achieve a 0 varus-valgus alignment and 0 posterior slope. The cut was  performed and verified using  the computer. The proximal tibia was sized and it was felt that a size 5 tibial tray was appropriate. Tibial and femoral trials were inserted followed by insertion of a 10 mm polyethylene insert. The knee was felt to be tight laterally.  The trial components were removed and the knee was brought into full extension and distracted using the Moreland retractors.  The posterolateral corner was carefully released using a combination of electrocautery and Metzenbaum scissors.  Trial components were reinserted followed by placement of a 10 mm polyethylene trial.  The knee was still lacking extension, so the trial components were again removed and an additional 4 mm of bone was resected from the proximal tibia and confirmed using the computer.  Trial components were reinserted followed by insertion of a 10 mm polyethylene trial.  This allowed for excellent mediolateral soft tissue balancing both in flexion and in full extension. Finally, the patella was cut and prepared so as to accommodate a 38 mm 3 peg oval dome patella. A patella trial was placed and the knee was placed through a range of motion with excellent patellar tracking appreciated. The femoral trial was removed after debridement of posterior osteophytes. The central post-hole for the tibial component was reamed followed by insertion of a keel punch. Tibial trials were then removed. Cut surfaces of bone were irrigated with copious amounts of normal saline with antibiotic solution using pulsatile lavage and then suctioned dry. Polymethylmethacrylate cement was prepared in the usual fashion using a vacuum mixer. Cement was applied to the cut surface of the proximal tibia as well as along the undersurface of a size 5 MBT tibial component. Tibial component was positioned and impacted into place. Excess cement was removed using Civil Service fast streamer. Cement was then applied to the cut surfaces of the femur as well as along the posterior flanges  of the size 5 femoral component. The femoral component was positioned and impacted into place. Excess cement was removed using [employer] fast streamer. A 10 mm polyethylene trial was inserted and the knee was brought into full extension with steady axial compression applied. Finally, cement was applied to the backside of a 38 mm 3 peg oval dome patella and the patellar component was positioned and patellar clamp applied. Excess cement was removed using [employer] fast streamer. After adequate curing of the cement, the tourniquet was deflated after a total tourniquet time of 111 minutes.  There was a moderate amount of oozing from the posterior recess of the knee.  A combination of Surgicel, Evicel, and Fibrillar was used in the posterior recess was packed.  After removal of the packing, good hemostasis was noted.  The knee was irrigated with copious amounts of normal saline with antibiotic solution using pulsatile lavage and then suctioned dry. 20 mL of 1.3% Exparel and 60 mL of 0.25% Marcaine in 40 mL of normal saline was injected along the posterior capsule, medial and lateral gutters, and along the arthrotomy site. A 10 mm stabilized rotating platform polyethylene insert was inserted and the knee was placed through a range of motion with excellent mediolateral soft tissue balancing appreciated and excellent patellar tracking noted. 2 medium drains were placed in the wound bed and brought out through separate stab incisions. The medial parapatellar portion of the incision was reapproximated using interrupted sutures of #1 Vicryl. Subcutaneous tissue was approximated in layers using first #0 Vicryl followed #2-0 Vicryl. The skin was approximated with skin staples. A sterile dressing was applied.  The patient tolerated the procedure well and was transported  to the recovery room in stable condition.    Stephanieann Popescu P. Holley Bouche., M.D.

## 2018-02-19 NOTE — H&P (Signed)
The patient has been re-examined, and the chart reviewed, and there have been no interval changes to the documented history and physical.    The risks, benefits, and alternatives have been discussed at length. The patient expressed understanding of the risks benefits and agreed with plans for surgical intervention.  James P. Hooten, Jr. M.D.    

## 2018-02-19 NOTE — Anesthesia Preprocedure Evaluation (Signed)
Anesthesia Evaluation  Patient identified by MRN, date of birth, ID band Patient awake    Reviewed: Allergy & Precautions, NPO status , Patient's Chart, lab work & pertinent test results  History of Anesthesia Complications (+) PONV and history of anesthetic complications  Airway Mallampati: III       Dental   Pulmonary sleep apnea and Continuous Positive Airway Pressure Ventilation , COPD,  COPD inhaler, Current Smoker,           Cardiovascular hypertension, Pt. on medications (-) Past MI and (-) CHF (-) dysrhythmias (-) Valvular Problems/Murmurs     Neuro/Psych neg Seizures    GI/Hepatic Neg liver ROS, GERD  Medicated and Controlled,  Endo/Other  diabetes, Type 2, Oral Hypoglycemic Agents  Renal/GU negative Renal ROS     Musculoskeletal   Abdominal   Peds  Hematology   Anesthesia Other Findings   Reproductive/Obstetrics                             Anesthesia Physical Anesthesia Plan  ASA: III  Anesthesia Plan: Spinal   Post-op Pain Management:    Induction:   PONV Risk Score and Plan:   Airway Management Planned: Nasal Cannula  Additional Equipment:   Intra-op Plan:   Post-operative Plan:   Informed Consent: I have reviewed the patients History and Physical, chart, labs and discussed the procedure including the risks, benefits and alternatives for the proposed anesthesia with the patient or authorized representative who has indicated his/her understanding and acceptance.     Plan Discussed with:   Anesthesia Plan Comments:         Anesthesia Quick Evaluation

## 2018-02-19 NOTE — Progress Notes (Signed)
Pt admitted to room 160 from PACU. Pt is A&Ox4, VSS. Foley draining, hemovac charged to suction, and polar care in place. Pt's pain has been controlled with PRN oxycodone and tramadol. Pt sat in the chair for about 2 hours after PT session. Pt and family educated on plan of care.   [place], Jerry Caras

## 2018-02-19 NOTE — Anesthesia Post-op Follow-up Note (Signed)
Anesthesia QCDR form completed.        

## 2018-02-19 NOTE — Evaluation (Signed)
Physical Therapy Evaluation Patient Details Name: Connor Brewer MRN: [id number] DOB: 02-25-1961 Today's Date: 02/19/2018   History of Present Illness  57 y/o male here with L TKA 8/23.   Clinical Impression  Pt very motivated to push himself and recover quickly.  He did very well with PT exam with PROM >90, did not need assist with mobility/transfers, easily did 10 SLRs, was able to ambulate 70 ft with consistent cadence and was not pain limited t/o the effort.  He responded well with ~15 minutes of exercises apart from the PT exam and overall did very well with POD0 session.  Pt will be able to go home with HHPT once medically cleared.    Follow Up Recommendations Home health PT    Equipment Recommendations       Recommendations for Other Services       Precautions / Restrictions Precautions Precautions: Knee;Fall Restrictions Weight Bearing Restrictions: Yes LLE Weight Bearing: Weight bearing as tolerated      Mobility  Bed Mobility Overal bed mobility: Independent             General bed mobility comments: Pt able to get to sitting EOB w/o assist or other issues  Transfers Overall transfer level: Independent Equipment used: Rolling walker (2 wheeled)             General transfer comment: minimal reminders for hand placement, sequencing but ultimately was able to rise w/o assist   Ambulation/Gait Ambulation/Gait assistance: Supervision Gait Distance (Feet): 70 Feet Assistive device: Rolling walker (2 wheeled)       General Gait Details: Pt with some initial stiffness as expected, but once warmed up he was able to minimize walker use and maintain consistent forward motion and cadence.  He had only very minimal fatigue with the effort and generally did very well POD0.  Stairs            Wheelchair Mobility    Modified Rankin (Stroke Patients Only)       Balance Overall balance assessment: Independent                                            Pertinent Vitals/Pain Pain Assessment: 0-10 Pain Score: 6  Pain Location: L knee    Home Living Family/patient expects to be discharged to:: Private residence Living Arrangements: Spouse/significant other   Type of Home: House Home Access: Stairs to enter Entrance Stairs-Rails: Psychiatric nurse of Steps: 3 Home Layout: One level Home Equipment: Environmental consultant - 2 wheels(need to insure it is appropriate, family to bring in)      Prior Function Level of Independence: Independent         Comments: Pt has been limited the last few months, but generally is able to be active     Hand Dominance        Extremity/Trunk Assessment   Upper Extremity Assessment Upper Extremity Assessment: Overall WFL for tasks assessed    Lower Extremity Assessment Lower Extremity Assessment: Overall WFL for tasks assessed(good L LE strength post-op)       Communication   Communication: No difficulties  Cognition Arousal/Alertness: Awake/alert Behavior During Therapy: WFL for tasks assessed/performed Overall Cognitive Status: Within Functional Limits for tasks assessed  General Comments      Exercises Total Joint Exercises Ankle Circles/Pumps: AROM;10 reps Quad Sets: Strengthening;10 reps Gluteal Sets: Strengthening;10 reps Short Arc Quad: Strengthening;10 reps Heel Slides: Strengthening;5 reps Hip ABduction/ADduction: Strengthening;10 reps Straight Leg Raises: AROM;Strengthening;10 reps Knee Flexion: PROM;5 reps Goniometric ROM: 0-91   Assessment/Plan    PT Assessment Patient needs continued PT services  PT Problem List Decreased strength;Decreased range of motion;Decreased activity tolerance;Decreased balance;Decreased mobility;Decreased coordination;Decreased knowledge of use of DME;Decreased safety awareness;Pain       PT Treatment Interventions DME instruction;Gait training;Stair  training;Functional mobility training;Therapeutic activities;Therapeutic exercise;Balance training;Patient/family education;Neuromuscular re-education    PT Goals (Current goals can be found in the Care Plan section)  Acute Rehab PT Goals Patient Stated Goal: do as well as possible as quickly as possible, pt motivated to push himself PT Goal Formulation: With patient Time For Goal Achievement: 03/05/18 Potential to Achieve Goals: Good    Frequency BID   Barriers to discharge        Co-evaluation               AM-PAC PT "6 Clicks" Daily Activity  Outcome Measure Difficulty turning over in bed (including adjusting bedclothes, sheets and blankets)?: None Difficulty moving from lying on back to sitting on the side of the bed? : None Difficulty sitting down on and standing up from a chair with arms (e.g., wheelchair, bedside commode, etc,.)?: None Help needed moving to and from a bed to chair (including a wheelchair)?: None Help needed walking in hospital room?: None Help needed climbing 3-5 steps with a railing? : A Little 6 Click Score: 23    End of Session Equipment Utilized During Treatment: Gait belt Activity Tolerance: Patient tolerated treatment well Patient left: with bed alarm set;with call bell/phone within reach;with family/visitor present Nurse Communication: Mobility status PT Visit Diagnosis: Muscle weakness (generalized) (M62.81);Difficulty in walking, not elsewhere classified (R26.2);Pain Pain - Right/Left: Left Pain - part of body: Knee    Time: 5176-1607 PT Time Calculation (min) (ACUTE ONLY): 28 min   Charges:   PT Evaluation $PT Eval Low Complexity: 1 Low PT Treatments $Therapeutic Exercise: 8-22 mins        Kreg Shropshire, DPT 02/19/2018, 4:48 PM

## 2018-02-19 NOTE — Anesthesia Procedure Notes (Signed)
Spinal  Patient location during procedure: OR Start time: 02/19/2018 7:25 AM End time: 02/19/2018 7:30 AM Staffing Anesthesiologist: Gunnar Fusi, MD Resident/CRNA: Eben Burow, CRNA Performed: resident/CRNA  Preanesthetic Checklist Completed: patient identified, site marked, surgical consent, pre-op evaluation, timeout performed, IV checked, risks and benefits discussed and monitors and equipment checked Spinal Block Patient position: sitting Prep: Betadine and site prepped and draped Patient monitoring: heart rate, continuous pulse ox and blood pressure Approach: midline Location: L3-4 Injection technique: single-shot Needle Needle type: Quincke  Needle gauge: 22 G Needle length: 12.7 cm

## 2018-02-19 NOTE — Transfer of Care (Signed)
Immediate Anesthesia Transfer of Care Note  Patient: Connor Brewer  Procedure(s) Performed: COMPUTER ASSISTED TOTAL KNEE ARTHROPLASTY (Left Knee)  Patient Location: PACU  Anesthesia Type:Spinal  Level of Consciousness: awake, alert , oriented and patient cooperative  Airway & Oxygen Therapy: Patient Spontanous Breathing and Patient connected to face mask oxygen  Post-op Assessment: Report given to RN and Post -op Vital signs reviewed and stable  Post vital signs: Reviewed and stable  Last Vitals:  Vitals Value Taken Time  BP 112/58 02/19/2018 12:32 PM  Temp 37.1 C 02/19/2018 12:30 PM  Pulse 62 02/19/2018 12:34 PM  Resp 18 02/19/2018 12:34 PM  SpO2 95 % 02/19/2018 12:34 PM  Vitals shown include unvalidated device data.  Last Pain:  Vitals:   02/19/18 0614  TempSrc: Tympanic  PainSc: 2       Patients Stated Pain Goal: 1 (11/65/79 0383)  Complications: No apparent anesthesia complications

## 2018-02-20 LAB — GLUCOSE, CAPILLARY
GLUCOSE-CAPILLARY: 132 mg/dL — AB (ref 70–99)
GLUCOSE-CAPILLARY: 142 mg/dL — AB (ref 70–99)
GLUCOSE-CAPILLARY: 76 mg/dL (ref 70–99)
Glucose-Capillary: 99 mg/dL (ref 70–99)

## 2018-02-20 NOTE — Clinical Social Work Note (Signed)
The CSW received a consult for SNF placement. PT is recommending HHPT. CSW is signing off. Please consult should needs arise.  Santiago Bumpers, MSW, Latanya Presser [phone]

## 2018-02-20 NOTE — Progress Notes (Signed)
OT Cancellation Note  Patient Details Name: Connor Brewer MRN: [id number] DOB: June 09, 1961   Cancelled Treatment:    Reason Eval/Treat Not Completed: OT screened, no needs identified, will sign off(Pt. and wife present. No further OT needs are indicated at this time. Pt. has A/E equipment at home, and has been using it daily following muliple consecutive orthopedic surgeries over the past several years.)  Harrel Carina, MS, OTR/L 02/20/2018, 11:24 AM

## 2018-02-20 NOTE — Progress Notes (Signed)
Physical Therapy Treatment Patient Details Name: Connor Brewer MRN: [id number] DOB: 03-28-1961 Today's Date: 02/20/2018    History of Present Illness 57 y/o male here with L TKA 8/23.     PT Comments    Pt is making good progress towards goals with increased ambulation distance this session. Pt able to navigate RN station with good cadence, slightly decreased speed. Good progress with ROM and reports no pain this date. Plan to perform stair training in PM session. Will continue to progress.   Follow Up Recommendations  Home health PT     Equipment Recommendations       Recommendations for Other Services       Precautions / Restrictions Precautions Precautions: Knee;Fall Precaution Booklet Issued: No Restrictions Weight Bearing Restrictions: Yes LLE Weight Bearing: Weight bearing as tolerated    Mobility  Bed Mobility Overal bed mobility: Independent             General bed mobility comments: Pt able to get to sitting EOB w/o assist or other issues  Transfers Overall transfer level: Needs assistance Equipment used: Rolling walker (2 wheeled) Transfers: Sit to/from Stand Sit to Stand: Min guard         General transfer comment: needed slight assist from low bed with tactile cues for balance. Once standing, able to don underwear. UPright posture noted  Ambulation/Gait Ambulation/Gait assistance: Min guard Gait Distance (Feet): 200 Feet Assistive device: Rolling walker (2 wheeled) Gait Pattern/deviations: Step-through pattern Gait velocity: 10' in 8 second   General Gait Details: ambulated with slightly decreased gait pattern, however reciprocal gait pattern noted. Able to carry conversation during ambulation without SOB symptoms.   Stairs             Wheelchair Mobility    Modified Rankin (Stroke Patients Only)       Balance                                            Cognition Arousal/Alertness: Awake/alert Behavior  During Therapy: WFL for tasks assessed/performed Overall Cognitive Status: Within Functional Limits for tasks assessed                                        Exercises Total Joint Exercises Goniometric ROM: L knee AROM: 0-95 degrees    General Comments        Pertinent Vitals/Pain Pain Assessment: No/denies pain    Home Living                      Prior Function            PT Goals (current goals can now be found in the care plan section) Acute Rehab PT Goals Patient Stated Goal: do as well as possible as quickly as possible, pt motivated to push himself PT Goal Formulation: With patient Time For Goal Achievement: 03/05/18 Potential to Achieve Goals: Good Progress towards PT goals: Progressing toward goals    Frequency    BID      PT Plan Current plan remains appropriate    Co-evaluation              AM-PAC PT "6 Clicks" Daily Activity  Outcome Measure  Difficulty turning over in bed (including adjusting bedclothes, sheets and blankets)?: None  Difficulty moving from lying on back to sitting on the side of the bed? : None Difficulty sitting down on and standing up from a chair with arms (e.g., wheelchair, bedside commode, etc,.)?: Unable Help needed moving to and from a bed to chair (including a wheelchair)?: None Help needed walking in hospital room?: None Help needed climbing 3-5 steps with a railing? : A Little 6 Click Score: 20    End of Session Equipment Utilized During Treatment: Gait belt Activity Tolerance: Patient tolerated treatment well Patient left: in chair;with chair alarm set;with SCD's reapplied Nurse Communication: Mobility status PT Visit Diagnosis: Muscle weakness (generalized) (M62.81);Difficulty in walking, not elsewhere classified (R26.2);Pain Pain - Right/Left: Left Pain - part of body: Knee     Time: 0732-0803 PT Time Calculation (min) (ACUTE ONLY): 31 min  Charges:  $Gait Training: 8-22  mins $Therapeutic Exercise: 8-22 mins                     Greggory Stallion, PT, DPT [phone]    Connor Brewer 02/20/2018, 8:23 AM

## 2018-02-20 NOTE — Progress Notes (Signed)
  Subjective: 1 Day Post-Op Procedure(s) (LRB): COMPUTER ASSISTED TOTAL KNEE ARTHROPLASTY (Left) Patient reports pain as moderate.   Patient seen in rounds with Dr. Posey Pronto. Patient is well, and has had no acute complaints or problems Plan is to go Home after hospital stay. Negative for chest pain and shortness of breath Fever: no Gastrointestinal: Negative for nausea and vomiting  Objective: Vital signs in last 24 hours: Temp:  [97.1 F (36.2 C)-98.7 F (37.1 C)] 98 F (36.7 C) (08/24 0444) Pulse Rate:  [46-63] 49 (08/24 0444) Resp:  [11-20] 19 (08/24 0444) BP: (102-133)/(57-79) 121/72 (08/24 0444) SpO2:  [94 %-100 %] 100 % (08/24 0444) FiO2 (%):  [28 %] 28 % (08/23 1344)  Intake/Output from previous day:  Intake/Output Summary (Last 24 hours) at 02/20/2018 0711 Last data filed at 02/20/2018 0445 Gross per 24 hour  Intake 3676.88 ml  Output 1680 ml  Net 1996.88 ml    Intake/Output this shift: No intake/output data recorded.  Labs: No results for input(s): HGB in the last 72 hours. No results for input(s): WBC, RBC, HCT, PLT in the last 72 hours. No results for input(s): NA, K, CL, CO2, BUN, CREATININE, GLUCOSE, CALCIUM in the last 72 hours. No results for input(s): LABPT, INR in the last 72 hours.   EXAM General - Patient is Alert and Oriented Extremity - Neurovascular intact Sensation intact distally Dorsiflexion/Plantar flexion intact Dressing/Incision - clean, dry, no drainage Motor Function - intact, moving foot and toes well on exam.  Able to do an independent straight leg raise.  Past Medical History:  Diagnosis Date  . Bronchitis    " aalergy driven"  . DVT (deep venous thrombosis) (HCC)    LLE  . GERD (gastroesophageal reflux disease)   . H/O seasonal allergies   . Hypertension   . Obesity   . Osteoarthritis    right hip  . PONV (postoperative nausea and vomiting)   . Pre-diabetes   . Sleep apnea    wears CPAP    Assessment/Plan: 1 Day Post-Op  Procedure(s) (LRB): COMPUTER ASSISTED TOTAL KNEE ARTHROPLASTY (Left) Active Problems:   S/P total knee arthroplasty  Estimated body mass index is 38 kg/m as calculated from the following:   Height as of this encounter: 5\' 11"  (1.803 m).   Weight as of this encounter: 123.6 kg. Advance diet Up with therapy D/C IV fluids Plan for discharge tomorrow home with home health physical therapy  DVT Prophylaxis - Lovenox, Foot Pumps and TED hose Weight-Bearing as tolerated to left leg  [name], PA-C Orthopaedic Surgery 02/20/2018, 7:11 AM

## 2018-02-20 NOTE — Progress Notes (Signed)
Physical Therapy Treatment Patient Details Name: Connor Brewer MRN: [id number] DOB: 05/28/1961 Today's Date: 02/20/2018    History of Present Illness 57 y/o male here with L TKA 8/23.     PT Comments    Pt is making good progress towards goals and has met PT goals for discharge. Stair training performed this date and reviewed there-ex with packet given and educated on frequency and duration. Pt is very motivated to perform therapy and rates moderate pain. Good ambulation speed with reciprocal gait pattern. Will continue to progress as able. Plan for dc tomorrow. Pt now requesting RW and BSC, left voicemail on CM extension.   Follow Up Recommendations  Home health PT     Equipment Recommendations  Rolling walker with 5" wheels;3in1 (PT)(bariatric BSC)    Recommendations for Other Services       Precautions / Restrictions Precautions Precautions: Knee;Fall Precaution Booklet Issued: Yes (comment) Restrictions Weight Bearing Restrictions: Yes LLE Weight Bearing: Weight bearing as tolerated    Mobility  Bed Mobility Overal bed mobility: Independent             General bed mobility comments: sit->supine with ease as well as repositioning himself in bed.  Transfers Overall transfer level: Needs assistance Equipment used: Rolling walker (2 wheeled) Transfers: Sit to/from Stand Sit to Stand: Supervision         General transfer comment: safe technique performed with upright posture and cues to push from seated surface.   Ambulation/Gait Ambulation/Gait assistance: Min guard Gait Distance (Feet): 300 Feet Assistive device: Rolling walker (2 wheeled) Gait Pattern/deviations: Step-through pattern Gait velocity: 10' in 7 seconds   General Gait Details: ambulated with slightly decreased gait pattern, however reciprocal gait pattern noted. Able to carry conversation during ambulation without SOB symptoms.   Stairs Stairs: Yes Stairs assistance: Min guard Stair  Management: Two rails Number of Stairs: 4 General stair comments: navigated stairs x 2 reps with B rails and step to gait pattern. CGA given for line management. PT demonstrated prior to performance   Wheelchair Mobility    Modified Rankin (Stroke Patients Only)       Balance                                            Cognition Arousal/Alertness: Awake/alert Behavior During Therapy: WFL for tasks assessed/performed Overall Cognitive Status: Within Functional Limits for tasks assessed                                        Exercises Other Exercises Other Exercises: Seated/supine ther-ex performed on L knee including ankle pumps, quad sets, SLRs, hip abd/add, SAQ, and LAQ. All ther0ex performed x 12 reps and educated on exercise packet. Same exercises performed in AM, forgot to document.    General Comments        Pertinent Vitals/Pain Pain Assessment: 0-10 Pain Score: 5  Pain Location: L knee Pain Descriptors / Indicators: Operative site guarding Pain Intervention(s): Limited activity within patient's tolerance;Ice applied;Repositioned    Home Living                      Prior Function            PT Goals (current goals can now be found in the care plan  section) Acute Rehab PT Goals Patient Stated Goal: do as well as possible as quickly as possible, pt motivated to push himself PT Goal Formulation: With patient Time For Goal Achievement: 03/05/18 Potential to Achieve Goals: Good Progress towards PT goals: Progressing toward goals    Frequency    BID      PT Plan Current plan remains appropriate    Co-evaluation              AM-PAC PT "6 Clicks" Daily Activity  Outcome Measure  Difficulty turning over in bed (including adjusting bedclothes, sheets and blankets)?: None Difficulty moving from lying on back to sitting on the side of the bed? : None Difficulty sitting down on and standing up from a chair  with arms (e.g., wheelchair, bedside commode, etc,.)?: A Little Help needed moving to and from a bed to chair (including a wheelchair)?: None Help needed walking in hospital room?: None Help needed climbing 3-5 steps with a railing? : A Little 6 Click Score: 22    End of Session Equipment Utilized During Treatment: Gait belt Activity Tolerance: Patient tolerated treatment well Patient left: in bed;with bed alarm set;with SCD's reapplied;with family/visitor present Nurse Communication: Mobility status PT Visit Diagnosis: Muscle weakness (generalized) (M62.81);Difficulty in walking, not elsewhere classified (R26.2);Pain Pain - Right/Left: Left Pain - part of body: Knee     Time: 9689-5702 PT Time Calculation (min) (ACUTE ONLY): 30 min  Charges:  $Gait Training: 8-22 mins $Therapeutic Exercise: 8-22 mins                     Greggory Stallion, PT, DPT [phone]    [name] 02/20/2018, 1:59 PM

## 2018-02-21 LAB — GLUCOSE, CAPILLARY
GLUCOSE-CAPILLARY: 118 mg/dL — AB (ref 70–99)
Glucose-Capillary: 97 mg/dL (ref 70–99)

## 2018-02-21 MED ORDER — OXYCODONE HCL 5 MG PO TABS: 5.0000 mg | ORAL_TABLET | ORAL | 0 refills | Status: DC | PRN

## 2018-02-21 MED ORDER — ENOXAPARIN SODIUM 40 MG/0.4ML ~~LOC~~ SOLN: 40.0000 mg | SUBCUTANEOUS | 0 refills | Status: DC

## 2018-02-21 MED ORDER — TRAMADOL HCL 50 MG PO TABS: 50.0000 mg | ORAL_TABLET | ORAL | 1 refills | Status: DC | PRN

## 2018-02-21 NOTE — Progress Notes (Signed)
Physical Therapy Treatment Patient Details Name: Connor Brewer MRN: [id number] DOB: [birth year] Today's Date: 02/21/2018    History of Present Illness 57 y/o male here with L TKA 8/23.     PT Comments    Pt is making good progress towards goals with increased independence with ambulation this date with less reliance on RW. Good endurance with there-ex, educated on frequency and duration. Great progress with AROM. Pt is very motivated to participate and eager for dc this date. Has met all PT goals, and plans to dc.  Follow Up Recommendations  Home health PT     Equipment Recommendations  Rolling walker with 5" wheels;3in1 (PT)    Recommendations for Other Services       Precautions / Restrictions Precautions Precautions: Knee;Fall Precaution Booklet Issued: Yes (comment) Restrictions Weight Bearing Restrictions: Yes LLE Weight Bearing: Weight bearing as tolerated    Mobility  Bed Mobility Overal bed mobility: Independent             General bed mobility comments: supine->sit with safe technique, upright posture noted.   Transfers Overall transfer level: Needs assistance Equipment used: Rolling walker (2 wheeled) Transfers: Sit to/from Stand Sit to Stand: Min guard         General transfer comment: slight assist needed from lower surface. CUes to push from seated surface. ONce standing, upright posture noted  Ambulation/Gait Ambulation/Gait assistance: Supervision Gait Distance (Feet): 200 Feet Assistive device: Rolling walker (2 wheeled) Gait Pattern/deviations: Step-through pattern Gait velocity: 10' in 7 seconds   General Gait Details: ambulated with safe technique and cues for upright posture and decreased WBing through arms. Recipocal gait pattern noted and good speed with heel strike present on B LE.   Stairs             Wheelchair Mobility    Modified Rankin (Stroke Patients Only)       Balance                                            Cognition Arousal/Alertness: Awake/alert Behavior During Therapy: WFL for tasks assessed/performed Overall Cognitive Status: Within Functional Limits for tasks assessed                                        Exercises Total Joint Exercises Goniometric ROM: L knee AROM 0-98 degress Other Exercises Other Exercises: Seated/supine ther-ex performed on L knee including ankle pumps, quad sets, SLRs, hip abd/add, SAQ, seated knee flexion stretches, and LAQ. All ther0ex performed x 15 reps with supervision and educated on exercise packet.    General Comments        Pertinent Vitals/Pain Pain Assessment: 0-10 Pain Score: 3  Pain Location: L knee Pain Descriptors / Indicators: Operative site guarding Pain Intervention(s): Limited activity within patient's tolerance;Ice applied;Repositioned    Home Living                      Prior Function            PT Goals (current goals can now be found in the care plan section) Acute Rehab PT Goals Patient Stated Goal: do as well as possible as quickly as possible, pt motivated to push himself PT Goal Formulation: With patient Time For Goal Achievement: 03/05/18 Potential  to Achieve Goals: Good Progress towards PT goals: Progressing toward goals    Frequency    BID      PT Plan Current plan remains appropriate    Co-evaluation              AM-PAC PT "6 Clicks" Daily Activity  Outcome Measure  Difficulty turning over in bed (including adjusting bedclothes, sheets and blankets)?: None Difficulty moving from lying on back to sitting on the side of the bed? : None Difficulty sitting down on and standing up from a chair with arms (e.g., wheelchair, bedside commode, etc,.)?: Unable Help needed moving to and from a bed to chair (including a wheelchair)?: None Help needed walking in hospital room?: None Help needed climbing 3-5 steps with a railing? : None 6 Click Score: 21    End of  Session Equipment Utilized During Treatment: Gait belt Activity Tolerance: Patient tolerated treatment well Patient left: in chair;with chair alarm set Nurse Communication: Mobility status PT Visit Diagnosis: Muscle weakness (generalized) (M62.81);Difficulty in walking, not elsewhere classified (R26.2);Pain Pain - Right/Left: Left Pain - part of body: Knee     Time: 0725-0803 PT Time Calculation (min) (ACUTE ONLY): 38 min  Charges:  $Gait Training: 8-22 mins $Therapeutic Exercise: 23-37 mins                     Greggory Stallion, PT, DPT [phone]    Trinidi Toppins 02/21/2018, 8:36 AM

## 2018-02-21 NOTE — Care Management Note (Signed)
Case Management Note  Patient Details  Name: JAIVEON SUPPES MRN: [id number] Date of Birth: Apr 13, 1961  Subjective/Objective: Patient to be discharged per MD order. Orders in place for home health services. Patient agreeable to home health services and given choice would like [facility]. Referral placed with Helene Kelp from [facility] who agrees to accept the patient for Pt services. Patient needs DME rolling walker and bariatric bedside commode. DME referral placed with Jermaine from [facility]. Family to provide transport home pending delivery of DME.   Ines Bloomer RN BSN RNCM [phone]                  Action/Plan:   Expected Discharge Date:  02/21/18               Expected Discharge Plan:  [place]  In-House Referral:     Discharge planning Services  CM Consult  Post Acute Care Choice:  Home Health, Durable Medical Equipment Choice offered to:  Patient  DME Arranged:  Bedside commode, Walker rolling DME Agency:  [place]:  PT [place]:  [facility] ([facility])  Status of Service:  Completed, signed off  If discussed at [employer], dates discussed:    Additional Comments:  Kathyrn Drown Pace [place], RN 02/21/2018, 9:15 AM

## 2018-02-21 NOTE — Progress Notes (Signed)
DISCHARGE NOTE:  Pt given discharge instructions and prescriptions (oxycodone, tramadol, lovenox ). Pt verbalized understanding. Surgical dressing changed, extra honeycomb sent with pt, walker sent home. Pt wheeled to car by staff member.

## 2018-02-21 NOTE — Anesthesia Postprocedure Evaluation (Signed)
Anesthesia Post Note  Patient: ALWYN CORDNER  Procedure(s) Performed: COMPUTER ASSISTED TOTAL KNEE ARTHROPLASTY (Left Knee)  Patient location during evaluation: PACU Anesthesia Type: Spinal Level of consciousness: oriented and awake and alert Pain management: pain level controlled Vital Signs Assessment: post-procedure vital signs reviewed and stable Respiratory status: spontaneous breathing, respiratory function stable and patient connected to nasal cannula oxygen Cardiovascular status: blood pressure returned to baseline and stable Postop Assessment: no headache, no backache and no apparent nausea or vomiting Anesthetic complications: no     Last Vitals:  Vitals:   02/21/18 0615 02/21/18 0723  BP: (!) 112/55 102/64  Pulse: (!) 42 (!) 50  Resp:  17  Temp:  36.7 C  SpO2:  100%    Last Pain:  Vitals:   02/21/18 0723  TempSrc: Oral  PainSc:                  Molli Barrows

## 2018-02-21 NOTE — Discharge Summary (Signed)
Physician Discharge Summary  Subjective: 2 Days Post-Op Procedure(s) (LRB): COMPUTER ASSISTED TOTAL KNEE ARTHROPLASTY (Left) Patient reports pain as mild.   Patient seen in rounds with Dr. Posey Pronto. Patient is well, and has had no acute complaints or problems Patient is ready to go home with home health physical therapy  Physician Discharge Summary  Patient ID: Connor Brewer MRN: [id number] DOB/AGE: 55/55/1962 55 y.o.  Admit date: 02/19/2018 Discharge date: 02/21/2018  Admission Diagnoses:  Discharge Diagnoses:  Active Problems:   S/P total knee arthroplasty   Discharged Condition: good  Hospital Course: The patient is postop day 2 from a left total knee replacement.  He is doing very well since surgery.  The patient has had stable vitals.  He has ambulated around the nurses station and is able to do a straight leg raise.  He will have to have a bowel movement before discharge.  Treatments: surgery:   Left total knee arthroplasty using computer-assisted navigation  SURGEON:  Marciano Sequin. M.D.  ASSISTANT:  Vance Peper, PA (present and scrubbed throughout the case, critical for assistance with exposure, retraction, instrumentation, and closure)  ANESTHESIA: spinal  ESTIMATED BLOOD LOSS: 150 mL  FLUIDS REPLACED: 800 mL of crystalloid  TOURNIQUET TIME: 111 minutes  DRAINS: 2 medium Hemovac drains  SOFT TISSUE RELEASES: Anterior cruciate ligament, posterior cruciate ligament, deep medial collateral ligament, patellofemoral ligament, and posterolateral corner  IMPLANTS UTILIZED: DePuy PFC Sigma size 5 posterior stabilized femoral component (cemented), size 5 MBT tibial component (cemented), 38 mm 3 peg oval dome patella (cemented), and a 10 mm stabilized rotating platform polyethylene insert.  Discharge Exam: Blood pressure 102/64, pulse (!) 50, temperature 98 F (36.7 C), temperature source Oral, resp. rate 18, height 5\' 11"  (1.803 m), weight 123.6 kg, SpO2 100  %.   Disposition: Discharge disposition: 01-Home or Self Care        Allergies as of 02/21/2018      Reactions   Ivp Dye [iodinated Diagnostic Agents] Anaphylaxis, Swelling, Other (See Comments)   THROAT, LIPS SWELL [Reaction took place in 1983.]   Metrizamide Anaphylaxis, Swelling, Other (See Comments)   THROAT, LIPS SWELL [Reaction took place in 1983.] This is a component of ivp dye      Medication List    STOP taking these medications   aspirin EC 81 MG tablet     TAKE these medications   amLODipine 10 MG tablet Commonly known as:  NORVASC Take 10 mg by mouth daily.   atorvastatin 20 MG tablet Commonly known as:  LIPITOR Take 20 mg by mouth daily.   CENTRUM SILVER 50+MEN PO Take 1 tablet by mouth daily.   enoxaparin 40 MG/0.4ML injection Commonly known as:  LOVENOX Inject 0.4 mLs (40 mg total) into the skin daily.   fexofenadine 180 MG tablet Commonly known as:  ALLEGRA Take 180 mg by mouth daily.   Fish Oil 500 MG Caps Take 500 mg by mouth daily.   losartan-hydrochlorothiazide 100-25 MG tablet Commonly known as:  HYZAAR Take 1 tablet by mouth daily.   metFORMIN 750 MG 24 hr tablet Commonly known as:  GLUCOPHAGE-XR Take 750 mg by mouth daily.   naproxen sodium 220 MG tablet Commonly known as:  ALEVE Take 440 mg by mouth daily as needed (for pain or headache).   omeprazole 20 MG capsule Commonly known as:  PRILOSEC Take 20 mg by mouth daily.   oxyCODONE 5 MG immediate release tablet Commonly known as:  Oxy IR/ROXICODONE Take 1 tablet (  5 mg total) by mouth every 4 (four) hours as needed for moderate pain (pain score 4-6).   traMADol 50 MG tablet Commonly known as:  ULTRAM Take 1-2 tablets (50-100 mg total) by mouth every 4 (four) hours as needed for moderate pain.   VENTOLIN HFA 108 (90 Base) MCG/ACT inhaler Generic drug:  albuterol INHALE 2 INHALATIONS INTO THE LUNGS EVERY 6 HOURS AS NEEDED FOR SEASONAL            Durable Medical  Equipment  (From admission, onward)         Start     Ordered   02/19/18 1344  DME Walker rolling  Once    Question:  Patient needs a walker to treat with the following condition  Answer:  Total knee replacement status   02/19/18 1343   02/19/18 1344  DME Bedside commode  Once    Question:  Patient needs a bedside commode to treat with the following condition  Answer:  Total knee replacement status   02/19/18 1343         Follow-up Information    Watt Climes, PA On 03/05/2018.   Specialty:  Physician Assistant Why:  at 1:15pm Contact information: [place] [address] [phone]        Dereck Leep, MD On 04/08/2018.   Specialty:  Orthopedic Surgery Why:  at 3:15pm Contact information: [place] [address] [phone]           Signed: Prescott Parma, Jemal 02/21/2018, 7:45 AM   Objective: Vital signs in last 24 hours: Temp:  [97.7 F (36.5 C)-98 F (36.7 C)] 98 F (36.7 C) (08/25 0723) Pulse Rate:  [42-51] 50 (08/25 0723) Resp:  [18] 18 (08/24 2321) BP: (99-134)/(55-65) 102/64 (08/25 0723) SpO2:  [99 %-100 %] 100 % (08/25 0723)  Intake/Output from previous day:  Intake/Output Summary (Last 24 hours) at 02/21/2018 0745 Last data filed at 02/21/2018 0443 Gross per 24 hour  Intake 2028.59 ml  Output 1690 ml  Net 338.59 ml    Intake/Output this shift: No intake/output data recorded.  Labs: No results for input(s): HGB in the last 72 hours. No results for input(s): WBC, RBC, HCT, PLT in the last 72 hours. No results for input(s): NA, K, CL, CO2, BUN, CREATININE, GLUCOSE, CALCIUM in the last 72 hours. No results for input(s): LABPT, INR in the last 72 hours.  EXAM: General - Patient is Alert and Oriented Extremity - Neurovascular intact Sensation intact distally Dorsiflexion/Plantar flexion intact Compartment soft Incision - clean, dry, no drainage, with  Hemovac removed Motor Function -plantarflexion and dorsiflexion are intact and able to straight leg raise independently.  Assessment/Plan: 2 Days Post-Op Procedure(s) (LRB): COMPUTER ASSISTED TOTAL KNEE ARTHROPLASTY (Left) Procedure(s) (LRB): COMPUTER ASSISTED TOTAL KNEE ARTHROPLASTY (Left) Past Medical History:  Diagnosis Date  . Bronchitis    " aalergy driven"  . DVT (deep venous thrombosis) (HCC)    LLE  . GERD (gastroesophageal reflux disease)   . H/O seasonal allergies   . Hypertension   . Obesity   . Osteoarthritis    right hip  . PONV (postoperative nausea and vomiting)   . Pre-diabetes   . Sleep apnea    wears CPAP   Active Problems:   S/P total knee arthroplasty  Estimated body mass index is 38 kg/m as calculated from the following:   Height as of this encounter: 5\' 11"  (1.803 m).   Weight  as of this encounter: 123.6 kg. Advance diet Up with therapy Discharge home with home health Diet - Regular diet Follow up - in 2 weeks Activity - WBAT Disposition - Home Condition Upon Discharge - Good DVT Prophylaxis - Lovenox and TED hose  [name], PA-C Orthopaedic Surgery 02/21/2018, 7:45 AM

## 2018-02-21 NOTE — Progress Notes (Signed)
  Subjective: 2 Days Post-Op Procedure(s) (LRB): COMPUTER ASSISTED TOTAL KNEE ARTHROPLASTY (Left) Patient reports pain as mild to moderate.   Patient seen in rounds with Dr. Posey Pronto. Patient is well, and has had no acute complaints or problems Plan is to go Home after hospital stay. Negative for chest pain and shortness of breath Fever: no Gastrointestinal: Negative for nausea and vomiting  Objective: Vital signs in last 24 hours: Temp:  [97.7 F (36.5 C)-98 F (36.7 C)] 98 F (36.7 C) (08/25 0723) Pulse Rate:  [42-51] 50 (08/25 0723) Resp:  [18] 18 (08/24 2321) BP: (99-134)/(55-65) 102/64 (08/25 0723) SpO2:  [99 %-100 %] 100 % (08/25 0723)  Intake/Output from previous day:  Intake/Output Summary (Last 24 hours) at 02/21/2018 0740 Last data filed at 02/21/2018 0443 Gross per 24 hour  Intake 2028.59 ml  Output 1690 ml  Net 338.59 ml    Intake/Output this shift: No intake/output data recorded.  Labs: No results for input(s): HGB in the last 72 hours. No results for input(s): WBC, RBC, HCT, PLT in the last 72 hours. No results for input(s): NA, K, CL, CO2, BUN, CREATININE, GLUCOSE, CALCIUM in the last 72 hours. No results for input(s): LABPT, INR in the last 72 hours.   EXAM General - Patient is Alert and Oriented Extremity - Neurovascular intact Sensation intact distally Dorsiflexion/Plantar flexion intact Dressing/Incision - clean, dry, no drainage.  The Hemovac was removed as well as the surgical bandage. Motor Function - intact, moving foot and toes well on exam.  Able to do an independent straight leg raise.  Patient ambulated 300 feet with physical therapy  Past Medical History:  Diagnosis Date  . Bronchitis    " aalergy driven"  . DVT (deep venous thrombosis) (HCC)    LLE  . GERD (gastroesophageal reflux disease)   . H/O seasonal allergies   . Hypertension   . Obesity   . Osteoarthritis    right hip  . PONV (postoperative nausea and vomiting)   .  Pre-diabetes   . Sleep apnea    wears CPAP    Assessment/Plan: 2 Days Post-Op Procedure(s) (LRB): COMPUTER ASSISTED TOTAL KNEE ARTHROPLASTY (Left) Active Problems:   S/P total knee arthroplasty  Estimated body mass index is 38 kg/m as calculated from the following:   Height as of this encounter: 5\' 11"  (1.803 m).   Weight as of this encounter: 123.6 kg. Advance diet. Continue physical therapy this morning. Discharge home today with home health physical therapy Needs to have a bowel movement before discharge.  DVT Prophylaxis - Lovenox, Foot Pumps and TED hose Weight-Bearing as tolerated to left leg  [name], PA-C Orthopaedic Surgery 02/21/2018, 7:40 AM

## 2018-02-22 ENCOUNTER — Encounter: Payer: Self-pay | Admitting: Orthopedic Surgery

## 2019-01-02 ENCOUNTER — Inpatient Hospital Stay
Admission: EM | Admit: 2019-01-02 | Discharge: 2019-01-09 | DRG: 378 | Disposition: A | Discharge status: Home / Self Care | Payer: Managed Care, Other (non HMO) | Attending: Internal Medicine | Admitting: Internal Medicine

## 2019-01-02 ENCOUNTER — Emergency Department: Payer: Managed Care, Other (non HMO)

## 2019-01-02 ENCOUNTER — Encounter: Payer: Self-pay | Admitting: Emergency Medicine

## 2019-01-02 DIAGNOSIS — Z91041 Radiographic dye allergy status: Secondary | ICD-10-CM

## 2019-01-02 DIAGNOSIS — K922 Gastrointestinal hemorrhage, unspecified: Secondary | ICD-10-CM | POA: Diagnosis present

## 2019-01-02 DIAGNOSIS — Z79899 Other long term (current) drug therapy: Secondary | ICD-10-CM | POA: Diagnosis not present

## 2019-01-02 DIAGNOSIS — E781 Pure hyperglyceridemia: Secondary | ICD-10-CM | POA: Diagnosis present

## 2019-01-02 DIAGNOSIS — R110 Nausea: Secondary | ICD-10-CM | POA: Diagnosis not present

## 2019-01-02 DIAGNOSIS — K921 Melena: Secondary | ICD-10-CM | POA: Diagnosis not present

## 2019-01-02 DIAGNOSIS — Z79891 Long term (current) use of opiate analgesic: Secondary | ICD-10-CM | POA: Diagnosis not present

## 2019-01-02 DIAGNOSIS — K219 Gastro-esophageal reflux disease without esophagitis: Secondary | ICD-10-CM | POA: Diagnosis present

## 2019-01-02 DIAGNOSIS — F17290 Nicotine dependence, other tobacco product, uncomplicated: Secondary | ICD-10-CM | POA: Diagnosis present

## 2019-01-02 DIAGNOSIS — I10 Essential (primary) hypertension: Secondary | ICD-10-CM | POA: Diagnosis present

## 2019-01-02 DIAGNOSIS — I959 Hypotension, unspecified: Secondary | ICD-10-CM | POA: Diagnosis present

## 2019-01-02 DIAGNOSIS — Z801 Family history of malignant neoplasm of trachea, bronchus and lung: Secondary | ICD-10-CM

## 2019-01-02 DIAGNOSIS — D62 Acute posthemorrhagic anemia: Secondary | ICD-10-CM | POA: Diagnosis present

## 2019-01-02 DIAGNOSIS — Z7982 Long term (current) use of aspirin: Secondary | ICD-10-CM

## 2019-01-02 DIAGNOSIS — D125 Benign neoplasm of sigmoid colon: Secondary | ICD-10-CM | POA: Diagnosis not present

## 2019-01-02 DIAGNOSIS — Z1159 Encounter for screening for other viral diseases: Secondary | ICD-10-CM

## 2019-01-02 DIAGNOSIS — E119 Type 2 diabetes mellitus without complications: Secondary | ICD-10-CM | POA: Diagnosis present

## 2019-01-02 DIAGNOSIS — F17210 Nicotine dependence, cigarettes, uncomplicated: Secondary | ICD-10-CM | POA: Diagnosis present

## 2019-01-02 DIAGNOSIS — Z96652 Presence of left artificial knee joint: Secondary | ICD-10-CM | POA: Diagnosis present

## 2019-01-02 DIAGNOSIS — Z96641 Presence of right artificial hip joint: Secondary | ICD-10-CM | POA: Diagnosis present

## 2019-01-02 DIAGNOSIS — J302 Other seasonal allergic rhinitis: Secondary | ICD-10-CM | POA: Diagnosis present

## 2019-01-02 DIAGNOSIS — K3189 Other diseases of stomach and duodenum: Secondary | ICD-10-CM | POA: Diagnosis present

## 2019-01-02 DIAGNOSIS — E785 Hyperlipidemia, unspecified: Secondary | ICD-10-CM | POA: Diagnosis present

## 2019-01-02 DIAGNOSIS — K635 Polyp of colon: Secondary | ICD-10-CM

## 2019-01-02 DIAGNOSIS — T39395A Adverse effect of other nonsteroidal anti-inflammatory drugs [NSAID], initial encounter: Secondary | ICD-10-CM | POA: Diagnosis present

## 2019-01-02 DIAGNOSIS — Z888 Allergy status to other drugs, medicaments and biological substances status: Secondary | ICD-10-CM

## 2019-01-02 DIAGNOSIS — G4733 Obstructive sleep apnea (adult) (pediatric): Secondary | ICD-10-CM | POA: Diagnosis present

## 2019-01-02 DIAGNOSIS — K5731 Diverticulosis of large intestine without perforation or abscess with bleeding: Principal | ICD-10-CM | POA: Diagnosis present

## 2019-01-02 DIAGNOSIS — Z7984 Long term (current) use of oral hypoglycemic drugs: Secondary | ICD-10-CM | POA: Diagnosis not present

## 2019-01-02 DIAGNOSIS — Z86718 Personal history of other venous thrombosis and embolism: Secondary | ICD-10-CM | POA: Diagnosis not present

## 2019-01-02 DIAGNOSIS — K641 Second degree hemorrhoids: Secondary | ICD-10-CM | POA: Diagnosis present

## 2019-01-02 DIAGNOSIS — R531 Weakness: Secondary | ICD-10-CM | POA: Diagnosis present

## 2019-01-02 DIAGNOSIS — K633 Ulcer of intestine: Secondary | ICD-10-CM | POA: Diagnosis not present

## 2019-01-02 HISTORY — DX: Type 2 diabetes mellitus without complications: E11.9

## 2019-01-02 LAB — CBC WITH DIFFERENTIAL/PLATELET
Abs Immature Granulocytes: 0.04 10*3/uL (ref 0.00–0.07)
Basophils Absolute: 0 10*3/uL (ref 0.0–0.1)
Basophils Relative: 0 %
Eosinophils Absolute: 0.1 10*3/uL (ref 0.0–0.5)
Eosinophils Relative: 1 %
HCT: 17.2 % — ABNORMAL LOW (ref 39.0–52.0)
Hemoglobin: 5.4 g/dL — ABNORMAL LOW (ref 13.0–17.0)
Immature Granulocytes: 1 %
Lymphocytes Relative: 16 %
Lymphs Abs: 1.3 10*3/uL (ref 0.7–4.0)
MCH: 29.2 pg (ref 26.0–34.0)
MCHC: 31.4 g/dL (ref 30.0–36.0)
MCV: 93 fL (ref 80.0–100.0)
Monocytes Absolute: 0.4 10*3/uL (ref 0.1–1.0)
Monocytes Relative: 5 %
Neutro Abs: 6.6 10*3/uL (ref 1.7–7.7)
Neutrophils Relative %: 77 %
Platelets: 174 10*3/uL (ref 150–400)
RBC: 1.85 MIL/uL — ABNORMAL LOW (ref 4.22–5.81)
RDW: 15.7 % — ABNORMAL HIGH (ref 11.5–15.5)
WBC: 8.4 10*3/uL (ref 4.0–10.5)
nRBC: 0.4 % — ABNORMAL HIGH (ref 0.0–0.2)

## 2019-01-02 LAB — COMPREHENSIVE METABOLIC PANEL
ALT: 29 U/L (ref 0–44)
AST: 31 U/L (ref 15–41)
Albumin: 2.8 g/dL — ABNORMAL LOW (ref 3.5–5.0)
Alkaline Phosphatase: 63 U/L (ref 38–126)
Anion gap: 9 (ref 5–15)
BUN: 31 mg/dL — ABNORMAL HIGH (ref 6–20)
CO2: 20 mmol/L — ABNORMAL LOW (ref 22–32)
Calcium: 7.7 mg/dL — ABNORMAL LOW (ref 8.9–10.3)
Chloride: 109 mmol/L (ref 98–111)
Creatinine, Ser: 0.93 mg/dL (ref 0.61–1.24)
GFR calc Af Amer: >60 mL/min (ref >60)
GFR calc non Af Amer: >60 mL/min (ref >60)
Glucose, Bld: 146 mg/dL — ABNORMAL HIGH (ref 70–99)
Potassium: 3.7 mmol/L (ref 3.5–5.1)
Sodium: 138 mmol/L (ref 135–145)
Total Bilirubin: 0.6 mg/dL (ref 0.3–1.2)
Total Protein: 5.2 g/dL — ABNORMAL LOW (ref 6.5–8.1)

## 2019-01-02 LAB — PREPARE RBC (CROSSMATCH)

## 2019-01-02 LAB — SARS CORONAVIRUS 2 BY RT PCR (HOSPITAL ORDER, PERFORMED IN ~~LOC~~ HOSPITAL LAB): SARS Coronavirus 2: NEGATIVE

## 2019-01-02 LAB — PROTIME-INR
INR: 1 (ref 0.8–1.2)
Prothrombin Time: 12.6 seconds (ref 11.4–15.2)

## 2019-01-02 MED ORDER — PANTOPRAZOLE SODIUM 40 MG IV SOLR
40.0000 mg | Freq: Once | INTRAVENOUS | Status: AC
  Administered 2019-01-02: 40 mg via INTRAVENOUS
  Filled 2019-01-02: qty 40

## 2019-01-02 MED ORDER — METFORMIN HCL ER 750 MG PO TB24: 750.0000 mg | ORAL_TABLET | Freq: Every day | ORAL | Status: DC

## 2019-01-02 MED ORDER — LOSARTAN POTASSIUM-HCTZ 50-12.5 MG PO TABS: 1.0000 | ORAL_TABLET | Freq: Every day | ORAL | Status: DC

## 2019-01-02 MED ORDER — PANTOPRAZOLE SODIUM 40 MG IV SOLR
40.0000 mg | Freq: Two times a day (BID) | INTRAVENOUS | Status: DC
  Administered 2019-01-03 – 2019-01-04 (×3): 40 mg via INTRAVENOUS
  Filled 2019-01-02 (×3): qty 40

## 2019-01-02 MED ORDER — LORATADINE 10 MG PO TABS
10.0000 mg | ORAL_TABLET | Freq: Every day | ORAL | Status: DC
  Administered 2019-01-03 – 2019-01-09 (×8): 10 mg via ORAL
  Filled 2019-01-02 (×7): qty 1

## 2019-01-02 MED ORDER — ONDANSETRON HCL 4 MG/2ML IJ SOLN: 4.0000 mg | Freq: Four times a day (QID) | INTRAMUSCULAR | Status: DC | PRN

## 2019-01-02 MED ORDER — SODIUM CHLORIDE 0.9% FLUSH: 3.0000 mL | Freq: Once | INTRAVENOUS | Status: DC

## 2019-01-02 MED ORDER — ATORVASTATIN CALCIUM 20 MG PO TABS
20.0000 mg | ORAL_TABLET | Freq: Every day | ORAL | Status: DC
  Administered 2019-01-03 – 2019-01-08 (×6): 20 mg via ORAL
  Filled 2019-01-02 (×6): qty 1

## 2019-01-02 MED ORDER — ACETAMINOPHEN 325 MG PO TABS
650.0000 mg | ORAL_TABLET | Freq: Four times a day (QID) | ORAL | Status: DC | PRN
  Administered 2019-01-03 – 2019-01-05 (×3): 650 mg via ORAL
  Filled 2019-01-02 (×3): qty 2

## 2019-01-02 MED ORDER — ACETAMINOPHEN 650 MG RE SUPP: 650.0000 mg | Freq: Four times a day (QID) | RECTAL | Status: DC | PRN

## 2019-01-02 MED ORDER — SODIUM CHLORIDE 0.9 % IV SOLN
10.0000 mL/h | Freq: Once | INTRAVENOUS | Status: AC
  Administered 2019-01-02: 10 mL/h via INTRAVENOUS

## 2019-01-02 MED ORDER — SODIUM CHLORIDE 0.9 % IV BOLUS
1000.0000 mL | Freq: Once | INTRAVENOUS | Status: AC
  Administered 2019-01-02: 1000 mL via INTRAVENOUS

## 2019-01-02 MED ORDER — ONDANSETRON HCL 4 MG PO TABS: 4.0000 mg | ORAL_TABLET | Freq: Four times a day (QID) | ORAL | Status: DC | PRN

## 2019-01-02 MED ORDER — AMLODIPINE BESYLATE 5 MG PO TABS: 10.0000 mg | ORAL_TABLET | Freq: Every day | ORAL | Status: DC

## 2019-01-02 NOTE — ED Notes (Signed)
ED TO INPATIENT HANDOFF REPORT  ED Nurse Name and Phone #: [phone]   S Name/Age/Gender Connor Brewer 58 y.o. male Room/Bed: ED17A/ED17A  Code Status   Code Status: Full Code  Home/SNF/Other Home Patient oriented to: self, place, time and situation Is this baseline? Yes   Triage Complete: Triage complete  Chief Complaint generalized weakness  Triage Note Patient presents to Emergency Department via [facility] from HOME with complaints of "just so tired" and dark bloody stool on Wed and Thursday, neg COVID test on Friday after suspected exposure 2 weeks prior.  Pt took imodium and oral hydration on Friday and Sat and had dark formed stool.  EMS reports pt pale and diaphoretic on standing and hypotensive.    History of DM and HTN.     Allergies Allergies  Allergen Reactions  . Ivp Dye [Iodinated Diagnostic Agents] Anaphylaxis, Swelling and Other (See Comments)    THROAT, LIPS SWELL [Reaction took place in 1983.]  . Metrizamide Anaphylaxis, Swelling and Other (See Comments)    THROAT, LIPS SWELL [Reaction took place in 1983.] This is a component of ivp dye    Level of Care/Admitting Diagnosis ED Disposition    ED Disposition Condition [facility] Area: [place] [100120]  Level of Care: Med-Surg [16]  Covid Evaluation: Asymptomatic Screening Protocol (No Symptoms)  Diagnosis: GI bleed [027253]  Admitting Physician: Mayer Camel [[id number]]  Attending Physician: Mayer Camel [[id number]]  Estimated length of stay: past midnight tomorrow  Certification:: I certify this patient will need inpatient services for at least 2 midnights  PT Class (Do Not Modify): Inpatient [101]  PT Acc Code (Do Not Modify): Private [1]       B Medical/Surgery History Past Medical History:  Diagnosis Date  . Bronchitis    " aalergy driven"  . DVT (deep venous thrombosis) (HCC)    LLE  . GERD (gastroesophageal reflux disease)   . H/O  seasonal allergies   . Hypertension   . Obesity   . Osteoarthritis    right hip  . PONV (postoperative nausea and vomiting)   . Pre-diabetes   . Sleep apnea    wears CPAP   Past Surgical History:  Procedure Laterality Date  . BACK SURGERY  2014   laser spine  . COLONOSCOPY W/ BIOPSIES AND POLYPECTOMY    . FRACTURE SURGERY  [phone]   right arm, left knee, right knee (rod in right arm, screws in right knee, one screw in left knee)  . KNEE ARTHROPLASTY Left 02/19/2018   Procedure: COMPUTER ASSISTED TOTAL KNEE ARTHROPLASTY;  Surgeon: Dereck Leep, MD;  Location: [facility];  Service: Orthopedics;  Laterality: Left;  Marland Kitchen MULTIPLE TOOTH EXTRACTIONS    . TOTAL HIP ARTHROPLASTY Right 06/09/2016   Procedure: TOTAL HIP ARTHROPLASTY ANTERIOR APPROACH;  Surgeon: Frederik Pear, MD;  Location: [facility];  Service: Orthopedics;  Laterality: Right;     A IV Location/Drains/Wounds Patient Lines/Drains/Airways Status   Active Line/Drains/Airways    Name:   Placement date:   Placement time:   Site:   Days:   Peripheral IV 01/02/19 Right Antecubital   01/02/19    1913    Antecubital   less than 1   Airway   06/09/16    1043     937   Airway   06/09/16    1018     937   Incision (Closed) 06/09/16 Hip Right   06/09/16    1155  937   Incision (Closed) 02/19/18 Knee Left   02/19/18    0841     317          Intake/Output Last 24 hours  Intake/Output Summary (Last 24 hours) at 01/02/2019 2248 Last data filed at 01/02/2019 1913 Gross per 24 hour  Intake 450 ml  Output -  Net 450 ml    Labs/Imaging Results for orders placed or performed during the hospital encounter of 01/02/19 (from the past 48 hour(s))  CBC with Differential/Platelet     Status: Abnormal   Collection Time: 01/02/19  7:22 PM  Result Value Ref Range   WBC 8.4 4.0 - 10.5 K/uL   RBC 1.85 (L) 4.22 - 5.81 MIL/uL   Hemoglobin 5.4 (L) 13.0 - 17.0 g/dL   HCT 17.2 (L) 39.0 - 52.0 %   MCV 93.0 80.0 - 100.0 fL   MCH 29.2 26.0 -  34.0 pg   MCHC 31.4 30.0 - 36.0 g/dL   RDW 15.7 (H) 11.5 - 15.5 %   Platelets 174 150 - 400 K/uL   nRBC 0.4 (H) 0.0 - 0.2 %   Neutrophils Relative % 77 %   Neutro Abs 6.6 1.7 - 7.7 K/uL   Lymphocytes Relative 16 %   Lymphs Abs 1.3 0.7 - 4.0 K/uL   Monocytes Relative 5 %   Monocytes Absolute 0.4 0.1 - 1.0 K/uL   Eosinophils Relative 1 %   Eosinophils Absolute 0.1 0.0 - 0.5 K/uL   Basophils Relative 0 %   Basophils Absolute 0.0 0.0 - 0.1 K/uL   Immature Granulocytes 1 %   Abs Immature Granulocytes 0.04 0.00 - 0.07 K/uL    Comment: Performed at [facility], [address] [place], [place] [address]  Comprehensive metabolic panel     Status: Abnormal   Collection Time: 01/02/19  7:22 PM  Result Value Ref Range   Sodium 138 135 - 145 mmol/L   Potassium 3.7 3.5 - 5.1 mmol/L   Chloride 109 98 - 111 mmol/L   CO2 20 (L) 22 - 32 mmol/L   Glucose, Bld 146 (H) 70 - 99 mg/dL   BUN 31 (H) 6 - 20 mg/dL   Creatinine, Ser 0.93 0.61 - 1.24 mg/dL   Calcium 7.7 (L) 8.9 - 10.3 mg/dL   Total Protein 5.2 (L) 6.5 - 8.1 g/dL   Albumin 2.8 (L) 3.5 - 5.0 g/dL   AST 31 15 - 41 U/L   ALT 29 0 - 44 U/L   Alkaline Phosphatase 63 38 - 126 U/L   Total Bilirubin 0.6 0.3 - 1.2 mg/dL   GFR calc non Af Amer >60 >60 mL/min   GFR calc Af Amer >60 >60 mL/min   Anion gap 9 5 - 15    Comment: Performed at [facility], [address] [place], [place] [address]  Protime-INR     Status: None   Collection Time: 01/02/19  7:22 PM  Result Value Ref Range   Prothrombin Time 12.6 11.4 - 15.2 seconds   INR 1.0 0.8 - 1.2    Comment: (NOTE) INR goal varies based on device and disease states. Performed at [facility], [address] [place], [place] [address]   Type and screen [place]     Status: None (Preliminary result)   Collection Time: 01/02/19  7:47 PM  Result Value Ref Range   ABO/RH(D) B POS    Antibody Screen NEG    Sample Expiration  01/05/2019,2359    Unit Number [id number]    Blood Component Type RED CELLS,LR    Unit division 00    Status of Unit ALLOCATED    Transfusion Status OK TO TRANSFUSE    Crossmatch Result Compatible    Unit Number [id number]    Blood Component Type RED CELLS,LR    Unit division 00    Status of Unit ISSUED    Transfusion Status OK TO TRANSFUSE    Crossmatch Result      Compatible Performed at [facility], [address] [place], [place] [address]   Prepare RBC     Status: None   Collection Time: 01/02/19  8:30 PM  Result Value Ref Range   Order Confirmation      ORDER PROCESSED BY BLOOD BANK Performed at [facility], [address], [place], [place] [address]   SARS Coronavirus 2 (CEPHEID - Performed in [place] hospital lab), Hosp Order     Status: None   Collection Time: 01/02/19  9:03 PM   Specimen: Nasopharyngeal Swab  Result Value Ref Range   SARS Coronavirus 2 NEGATIVE NEGATIVE    Comment: (NOTE) If result is NEGATIVE SARS-CoV-2 target nucleic acids are NOT DETECTED. The SARS-CoV-2 RNA is generally detectable in upper and lower  respiratory specimens during the acute phase of infection. The lowest  concentration of SARS-CoV-2 viral copies this assay can detect is 250  copies / mL. A negative result does not preclude SARS-CoV-2 infection  and should not be used as the sole basis for treatment or other  patient management decisions.  A negative result may occur with  improper specimen collection / handling, submission of specimen other  than nasopharyngeal swab, presence of viral mutation(s) within the  areas targeted by this assay, and inadequate number of viral copies  (<250 copies / mL). A negative result must be combined with clinical  observations, patient history, and epidemiological information. If result is POSITIVE SARS-CoV-2 target nucleic acids are DETECTED. The SARS-CoV-2 RNA is generally detectable in upper and lower   respiratory specimens dur ing the acute phase of infection.  Positive  results are indicative of active infection with SARS-CoV-2.  Clinical  correlation with patient history and other diagnostic information is  necessary to determine patient infection status.  Positive results do  not rule out bacterial infection or co-infection with other viruses. If result is PRESUMPTIVE POSTIVE SARS-CoV-2 nucleic acids MAY BE PRESENT.   A presumptive positive result was obtained on the submitted specimen  and confirmed on repeat testing.  While 2019 novel coronavirus  (SARS-CoV-2) nucleic acids may be present in the submitted sample  additional confirmatory testing may be necessary for epidemiological  and / or clinical management purposes  to differentiate between  SARS-CoV-2 and other Sarbecovirus currently known to infect humans.  If clinically indicated additional testing with an alternate test  methodology [phone]) is advised. The SARS-CoV-2 RNA is generally  detectable in upper and lower respiratory sp ecimens during the acute  phase of infection. The expected result is Negative. Fact Sheet for Patients:  StrictlyIdeas.no Fact Sheet for Healthcare Providers: BankingDealers.co.za This test is not yet approved or cleared by the [place] FDA and has been authorized for detection and/or diagnosis of SARS-CoV-2 by FDA under an Emergency Use Authorization (EUA).  This EUA will remain in effect (meaning this test can be used) for the duration of the COVID-19 declaration under Section 564(b)(1) of the Act, 21 U.S.C. section 360bbb-3(b)(1), unless the  authorization is terminated or revoked sooner. Performed at [facility], [address] [place], [place] [address]    Dg Chest Portable 1 View  Result Date: 01/02/2019 CLINICAL DATA:  Shortness of breath EXAM: PORTABLE CHEST 1 VIEW COMPARISON:  July 05, 2014 FINDINGS: The heart size  and mediastinal contours are within normal limits. Both lungs are clear. The visualized skeletal structures are unremarkable. IMPRESSION: No active cardiopulmonary disease. Electronically Signed   By: Abelardo Diesel M.D.   On: 01/02/2019 20:21    Pending Labs Unresulted Labs (From admission, onward)    Start     Ordered   01/03/19 2234  Hemoglobin and hematocrit, blood  Once-Timed,   STAT    Comments: AFTER TRANSFUSION COMPLETED    01/02/19 2241   01/03/19 4970  Basic metabolic panel  Tomorrow morning,   STAT     01/02/19 2241   01/03/19 0500  CBC  Tomorrow morning,   STAT     01/02/19 2241   01/03/19 0500  Protime-INR  Tomorrow morning,   STAT     01/02/19 2241   01/02/19 2242  Occult blood card to lab, stool RN will collect  Once,   STAT    Question:  Specimen to be collected by?  Answer:  RN will collect   01/02/19 2241   01/02/19 2242  HIV antibody (Routine Testing)  Once,   STAT     01/02/19 2241   01/02/19 1928  Urinalysis, Complete w Microscopic  ONCE - STAT,   STAT     01/02/19 1928          Vitals/Pain Today's Vitals   01/02/19 2145 01/02/19 2155 01/02/19 2200 01/02/19 2213  BP: 99/79 99/79 (!) 119/56 (!) 108/52  Pulse: 77 82 77 83  Resp: 16 19 19 20   Temp:  99.4 F (37.4 C)  98.6 F (37 C)  TempSrc:  Oral    SpO2: 100% 99% 98% 97%  Weight:      Height:      PainSc:        Isolation Precautions No active isolations  Medications Medications  sodium chloride flush (NS) 0.9 % injection 3 mL (3 mLs Intravenous Not Given 01/02/19 1939)  amLODipine (NORVASC) tablet 10 mg (has no administration in time range)  atorvastatin (LIPITOR) tablet 20 mg (has no administration in time range)  loratadine (CLARITIN) tablet 10 mg (has no administration in time range)  losartan-hydrochlorothiazide (HYZAAR) 50-12.5 MG per tablet 1 tablet (has no administration in time range)  metFORMIN (GLUCOPHAGE-XR) 24 hr tablet 750 mg (has no administration in time range)  pantoprazole  (PROTONIX) injection 40 mg (has no administration in time range)  acetaminophen (TYLENOL) tablet 650 mg (has no administration in time range)    Or  acetaminophen (TYLENOL) suppository 650 mg (has no administration in time range)  ondansetron (ZOFRAN) tablet 4 mg (has no administration in time range)    Or  ondansetron (ZOFRAN) injection 4 mg (has no administration in time range)  sodium chloride 0.9 % bolus 1,000 mL (1,000 mLs Intravenous New Bag/Given 01/02/19 1956)  0.9 %  sodium chloride infusion (10 mL/hr Intravenous New Bag/Given 01/02/19 2204)  pantoprazole (PROTONIX) injection 40 mg (40 mg Intravenous Given 01/02/19 2016)    Mobility walks Low fall risk   Focused Assessments 1   R Recommendations: See Admitting Provider Note  Report given to:   Additional Notes:

## 2019-01-02 NOTE — ED Notes (Signed)
Report given to Tricia RN 

## 2019-01-02 NOTE — ED Provider Notes (Signed)
Patient was seen and examined by me, came in with weakness and GI bleeding.  Clinically he appears weak but blood pressure borderline low.  He has had diarrhea which has resolved.  He had dark bloody stools on Wednesday and Thursday.  He is pale and diaphoretic when he stands.  Hemoglobin here was significantly low.  I have ordered 2 units of packed red blood cells for him.  He will need to be admitted for further work-up and GI consultation.   Earleen Newport, MD 01/02/19 Karl Bales

## 2019-01-02 NOTE — ED Triage Notes (Signed)
Patient presents to Emergency Department via [facility] from HOME with complaints of "just so tired" and dark bloody stool on Wed and Thursday, neg COVID test on Friday after suspected exposure 2 weeks prior.  Pt took imodium and oral hydration on Friday and Sat and had dark formed stool.  EMS reports pt pale and diaphoretic on standing and hypotensive.    History of DM and HTN.

## 2019-01-02 NOTE — ED Provider Notes (Signed)
[facility] Emergency Department Provider Note  ____________________________________________  Time seen: Approximately 7:40 PM  I have reviewed the triage vital signs and the nursing notes.   HISTORY  Chief Complaint Fatigue and Rectal Bleeding    HPI Connor Brewer is a 58 y.o. male that presents the presents to the emergency department for evaluation of weakness, fatigue, diarrhea, dark red stools for 5 days.  Patient states that he was taking Imodium, which helped the diarrhea but still sees red streaks in the diarrhea.  No fevers.    Past Medical History:  Diagnosis Date  . Bronchitis    " aalergy driven"  . DVT (deep venous thrombosis) (HCC)    LLE  . GERD (gastroesophageal reflux disease)   . H/O seasonal allergies   . Hypertension   . Obesity   . Osteoarthritis    right hip  . PONV (postoperative nausea and vomiting)   . Pre-diabetes   . Sleep apnea    wears CPAP    Patient Active Problem List   Diagnosis Date Noted  . S/P total knee arthroplasty 02/19/2018  . Primary osteoarthritis of left knee 01/24/2018  . 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 Primary osteoarthritis of right hip 06/06/2016  . Hypertriglyceridemia 05/19/2014  . Morbid obesity, unspecified obesity type ([place]) 05/19/2014  . Type 2 diabetes mellitus without complication ([place]) 21/19/4174  . Allergic rhinitis 10/03/2013  . DVT (deep venous thrombosis) ([place]) 10/03/2013  . Benign essential hypertension 10/03/2013  . History of ulcer disease 10/03/2013  . Osteoarthrosis involving lower leg 10/03/2013    Past Surgical History:  Procedure Laterality Date  . BACK SURGERY  2014   laser spine  . COLONOSCOPY W/ BIOPSIES AND POLYPECTOMY    . FRACTURE SURGERY  [phone]   right arm, left knee, right knee (rod in right arm, screws in right knee, one screw in left knee)  . KNEE ARTHROPLASTY Left 02/19/2018   Procedure: COMPUTER ASSISTED TOTAL KNEE ARTHROPLASTY;  Surgeon:  Dereck Leep, MD;  Location: [facility] ORS;  Service: Orthopedics;  Laterality: Left;  Marland Kitchen MULTIPLE TOOTH EXTRACTIONS    . TOTAL HIP ARTHROPLASTY Right 06/09/2016   Procedure: TOTAL HIP ARTHROPLASTY ANTERIOR APPROACH;  Surgeon: Frederik Pear, MD;  Location: [place];  Service: Orthopedics;  Laterality: Right;    Prior to Admission medications   Medication Sig Start Date End Date Taking? Authorizing Provider  amLODipine (NORVASC) 10 MG tablet Take 10 mg by mouth daily.    Yes [provider]  atorvastatin (LIPITOR) 20 MG tablet Take 20 mg by mouth daily.   Yes [provider]  bifidobacterium infantis (ALIGN) capsule Take 1 capsule by mouth daily.   Yes [provider]  fexofenadine (ALLEGRA) 180 MG tablet Take 180 mg by mouth daily.    Yes [provider]  losartan-hydrochlorothiazide (HYZAAR) 50-12.5 MG tablet Take 1 tablet by mouth daily.    Yes [provider]  metFORMIN (GLUCOPHAGE-XR) 750 MG 24 hr tablet Take 750 mg by mouth daily. 01/17/17  Yes [provider]  Multiple Vitamins-Minerals (CENTRUM SILVER 50+MEN PO) Take 1 tablet by mouth daily.   Yes [provider]  Omega-3 Fatty Acids (FISH OIL) 500 MG CAPS Take 500 mg by mouth daily.   Yes [provider]  omeprazole (PRILOSEC) 20 MG capsule Take 20 mg by mouth daily.   Yes [provider]  albuterol (VENTOLIN HFA) 108 (90 Base) MCG/ACT inhaler INHALE 2 INHALATIONS INTO THE LUNGS EVERY 6 HOURS AS NEEDED  FOR SEASONAL 05/05/16   [provider]  enoxaparin (LOVENOX) 40 MG/0.4ML injection Inject 0.4 mLs (40 mg total) into the skin daily. Patient not taking: Reported on 01/02/2019 02/21/18   [name], PA-C  naproxen sodium (ALEVE) 220 MG tablet Take 440 mg by mouth daily as needed (for pain or headache).    [provider]  oxyCODONE (OXY IR/ROXICODONE) 5 MG immediate release tablet Take 1 tablet (5 mg total) by mouth every 4 (four) hours as needed for  moderate pain (pain score 4-6). Patient not taking: Reported on 01/02/2019 02/21/18   [name], PA-C  traMADol (ULTRAM) 50 MG tablet Take 1-2 tablets (50-100 mg total) by mouth every 4 (four) hours as needed for moderate pain. Patient not taking: Reported on 01/02/2019 02/21/18   [name], PA-C    Allergies Ivp dye [iodinated diagnostic agents] and Metrizamide  Family History  Problem Relation Age of Onset  . Lung cancer Father   . Cancer Brother     Social History Social History   Tobacco Use  . Smoking status: Current Some Day Smoker    Types: Cigars  . Smokeless tobacco: Never Used  Substance Use Topics  . Alcohol use: Yes    Comment: social  . Drug use: No     Review of Systems  Constitutional: No fever/chills. Positive for fatigue, dizziness, weakness. Cardiovascular: No chest pain. Respiratory: No cough. Positive for SOB. Gastrointestinal: No abdominal pain.  No vomiting.  Musculoskeletal: Negative for musculoskeletal pain. Skin: Negative for rash, abrasions, lacerations, ecchymosis.   ____________________________________________   PHYSICAL EXAM:  VITAL SIGNS: ED Triage Vitals  Enc Vitals Group     BP 01/02/19 1925 (!) 132/54     Pulse Rate 01/02/19 1925 75     Resp 01/02/19 1925 (!) 21     Temp 01/02/19 1925 98.9 F (37.2 C)     Temp Source 01/02/19 1925 Oral     SpO2 01/02/19 1913 100 %     Weight 01/02/19 1926 290 lb (131.5 kg)     Height 01/02/19 1926 5\' 11"  (1.803 m)     Head Circumference --      Peak Flow --      Pain Score 01/02/19 1926 0     Pain Loc --      Pain Edu? --      Excl. in [place]? --      Constitutional: Alert and oriented.  Eyes: Conjunctivae are normal. PERRL. EOMI. Head: Atraumatic. ENT:      Ears:      Nose: No congestion/rhinnorhea.      Mouth/Throat: Mucous membranes are moist.  Neck: No stridor.  Cardiovascular: Normal rate, regular rhythm.  Good peripheral circulation. Respiratory: Normal respiratory effort  without tachypnea or retractions. Lungs CTAB. Good air entry to the bases with no decreased or absent breath sounds. Gastrointestinal: Bowel sounds 4 quadrants. Soft and nontender to palpation. No guarding or rigidity. No palpable masses. No distention.  Musculoskeletal: Full range of motion to all extremities. No gross deformities appreciated. Neurologic:  Normal speech and language. No gross focal neurologic deficits are appreciated.  Skin:  Skin is warm, dry and intact. No rash noted. Psychiatric: Mood and affect are normal. Speech and behavior are normal. Patient exhibits appropriate insight and judgement.   ____________________________________________   LABS (all labs ordered are listed, but only abnormal results are displayed)  Labs Reviewed  CBC WITH DIFFERENTIAL/PLATELET - Abnormal; Notable for the following components:      Result  Value   RBC 1.85 (*)    Hemoglobin 5.4 (*)    HCT 17.2 (*)    RDW 15.7 (*)    nRBC 0.4 (*)    All other components within normal limits  COMPREHENSIVE METABOLIC PANEL - Abnormal; Notable for the following components:   CO2 20 (*)    Glucose, Bld 146 (*)    BUN 31 (*)    Calcium 7.7 (*)    Total Protein 5.2 (*)    Albumin 2.8 (*)    All other components within normal limits  SARS CORONAVIRUS 2 (HOSPITAL ORDER, [place] [facility])  PROTIME-INR  URINALYSIS, COMPLETE (UACMP) WITH MICROSCOPIC  CBG MONITORING, ED  TYPE AND SCREEN  PREPARE RBC (CROSSMATCH)   ____________________________________________  EKG   ____________________________________________  RADIOLOGY Robinette Haines, personally viewed and evaluated these images (plain radiographs) as part of my medical decision making, as well as reviewing the written report by the radiologist.   Dg Chest Portable 1 View  Result Date: 01/02/2019 CLINICAL DATA:  Shortness of breath EXAM: PORTABLE CHEST 1 VIEW COMPARISON:  July 05, 2014 FINDINGS: The heart size and  mediastinal contours are within normal limits. Both lungs are clear. The visualized skeletal structures are unremarkable. IMPRESSION: No active cardiopulmonary disease. Electronically Signed   By: Abelardo Diesel M.D.   On: 01/02/2019 20:21    ____________________________________________    PROCEDURES  Procedure(s) performed:    Procedures    Medications  sodium chloride flush (NS) 0.9 % injection 3 mL (3 mLs Intravenous Not Given 01/02/19 1939)  0.9 %  sodium chloride infusion (has no administration in time range)  sodium chloride 0.9 % bolus 1,000 mL (1,000 mLs Intravenous New Bag/Given 01/02/19 1956)  pantoprazole (PROTONIX) injection 40 mg (40 mg Intravenous Given 01/02/19 2016)     ____________________________________________   INITIAL IMPRESSION / ASSESSMENT AND PLAN / ED COURSE  Pertinent labs & imaging results that were available during my care of the patient were reviewed by me and considered in my medical decision making (see chart for details).  Review of the [place] CSRS was performed in accordance of the [place] prior to dispensing any controlled drugs.   Patient presented to the emergency department for evaluation of weakness, dizziness, nausea, fatigue, diarrhea, dark bloody stools for several days.  Hemoglobin 5.4, hematocrit 17.2.  Patient will be admitted for GI bleed.  Packed RBCs ordered. Dr. Levada Dy Sales accepts patient for admission.    ____________________________________________  FINAL CLINICAL IMPRESSION(S) / ED DIAGNOSES  Final diagnoses:  Gastrointestinal hemorrhage, unspecified gastrointestinal hemorrhage type  Weakness      NEW MEDICATIONS STARTED DURING THIS VISIT:  ED Discharge Orders    None          This chart was dictated using voice recognition software/Dragon. Despite best efforts to proofread, errors can occur which can change the meaning. Any change was purely unintentional.    Laban Emperor, PA-C 01/02/19 2132    Earleen Newport, MD 01/02/19 2219

## 2019-01-03 ENCOUNTER — Encounter
Admission: EM | Disposition: A | Discharge status: Home / Self Care | Payer: Self-pay | Source: Home / Self Care | Attending: Internal Medicine

## 2019-01-03 ENCOUNTER — Inpatient Hospital Stay: Payer: Managed Care, Other (non HMO) | Admitting: Anesthesiology

## 2019-01-03 ENCOUNTER — Other Ambulatory Visit: Payer: Self-pay

## 2019-01-03 ENCOUNTER — Encounter: Payer: Self-pay | Admitting: Anesthesiology

## 2019-01-03 DIAGNOSIS — K3189 Other diseases of stomach and duodenum: Secondary | ICD-10-CM

## 2019-01-03 DIAGNOSIS — D62 Acute posthemorrhagic anemia: Secondary | ICD-10-CM

## 2019-01-03 HISTORY — PX: ESOPHAGOGASTRODUODENOSCOPY (EGD) WITH PROPOFOL: SHX5813

## 2019-01-03 LAB — CBC
HCT: 22.5 % — ABNORMAL LOW (ref 39.0–52.0)
Hemoglobin: 7.3 g/dL — ABNORMAL LOW (ref 13.0–17.0)
MCH: 29.6 pg (ref 26.0–34.0)
MCHC: 32.4 g/dL (ref 30.0–36.0)
MCV: 91.1 fL (ref 80.0–100.0)
Platelets: 169 10*3/uL (ref 150–400)
RBC: 2.47 MIL/uL — ABNORMAL LOW (ref 4.22–5.81)
RDW: 15.5 % (ref 11.5–15.5)
WBC: 11.8 10*3/uL — ABNORMAL HIGH (ref 4.0–10.5)
nRBC: 0.3 % — ABNORMAL HIGH (ref 0.0–0.2)

## 2019-01-03 LAB — GLUCOSE, CAPILLARY: Glucose-Capillary: 124 mg/dL — ABNORMAL HIGH (ref 70–99)

## 2019-01-03 LAB — PROTIME-INR
INR: 1 (ref 0.8–1.2)
Prothrombin Time: 12.6 seconds (ref 11.4–15.2)

## 2019-01-03 LAB — BASIC METABOLIC PANEL
Anion gap: 7 (ref 5–15)
BUN: 33 mg/dL — ABNORMAL HIGH (ref 6–20)
CO2: 25 mmol/L (ref 22–32)
Calcium: 8.6 mg/dL — ABNORMAL LOW (ref 8.9–10.3)
Chloride: 107 mmol/L (ref 98–111)
Creatinine, Ser: 0.98 mg/dL (ref 0.61–1.24)
GFR calc Af Amer: >60 mL/min (ref >60)
GFR calc non Af Amer: >60 mL/min (ref >60)
Glucose, Bld: 132 mg/dL — ABNORMAL HIGH (ref 70–99)
Potassium: 3.8 mmol/L (ref 3.5–5.1)
Sodium: 139 mmol/L (ref 135–145)

## 2019-01-03 LAB — HEMOGLOBIN AND HEMATOCRIT, BLOOD
HCT: 22 % — ABNORMAL LOW (ref 39.0–52.0)
HCT: 21.7 % — ABNORMAL LOW (ref 39.0–52.0)
Hemoglobin: 7.2 g/dL — ABNORMAL LOW (ref 13.0–17.0)
Hemoglobin: 7.1 g/dL — ABNORMAL LOW (ref 13.0–17.0)

## 2019-01-03 LAB — PREPARE RBC (CROSSMATCH)

## 2019-01-03 SURGERY — ESOPHAGOGASTRODUODENOSCOPY (EGD) WITH PROPOFOL
Anesthesia: General

## 2019-01-03 MED ORDER — PROPOFOL 10 MG/ML IV BOLUS
INTRAVENOUS | Status: DC | PRN
  Administered 2019-01-03: 30 mg via INTRAVENOUS
  Administered 2019-01-03: 100 mg via INTRAVENOUS

## 2019-01-03 MED ORDER — SODIUM CHLORIDE 0.9 % IV SOLN
INTRAVENOUS | Status: DC
  Administered 2019-01-03 – 2019-01-04 (×5): via INTRAVENOUS

## 2019-01-03 MED ORDER — SODIUM CHLORIDE 0.9% IV SOLUTION: Freq: Once | INTRAVENOUS | Status: DC

## 2019-01-03 MED ORDER — PEG 3350-KCL-NA BICARB-NACL 420 G PO SOLR
4000.0000 mL | Freq: Once | ORAL | Status: AC
  Administered 2019-01-03: 4000 mL via ORAL
  Filled 2019-01-03 (×2): qty 4000

## 2019-01-03 MED ORDER — PROPOFOL 500 MG/50ML IV EMUL
INTRAVENOUS | Status: DC | PRN
  Administered 2019-01-03: 140 ug/kg/min via INTRAVENOUS

## 2019-01-03 NOTE — H&P (Signed)
[facility] [place] NAME: Connor Brewer    MR#:  [id number]  DATE OF BIRTH:  [birth year]  DATE OF ADMISSION:  01/02/2019  PRIMARY CARE PHYSICIAN: Juluis Pitch, MD   REQUESTING/REFERRING PHYSICIAN: Kerin Salen, physician assistant  CHIEF COMPLAINT:   Chief Complaint  Patient presents with  . Fatigue  . Rectal Bleeding    HISTORY OF PRESENT ILLNESS:  Connor Brewer  is a 58 y.o. male with a known history of DVT on aspirin, GERD, hypertension, osteoarthritis, obstructive sleep apnea on CPAP therapy.  He presented to the emergency room complaining of 5-day history of weakness, fatigue, and diarrhea with dark red blood in his stool.  Patient has experienced dizziness on standing as well.  In the emergency room, he was noted to be pale and diaphoretic on standing.  He has noted nausea however no vomiting.  He denies abdominal pain.  He denies fever or chills.  He denies diarrhea currently however has been experiencing diarrhea over the last 5 days.  He denies chest pain or shortness of breath.  On arrival to the emergency room patient is mildly hypotensive with blood pressure 97/47 with heart rate 90.  Hemoglobin is 5.4 with hematocrit 17.2 and platelet count 174.  He is receiving 2 units packed red blood cells.  Patient also tells me he takes Aleve 1-2 times a day for arthritic pain.  He denies a prior history of GI bleed.  He has been admitted to the hospital service for further management  PAST MEDICAL HISTORY:   Past Medical History:  Diagnosis Date  . Bronchitis    " aalergy driven"  . DVT (deep venous thrombosis) (HCC)    LLE  . GERD (gastroesophageal reflux disease)   . H/O seasonal allergies   . Hypertension   . Obesity   . Osteoarthritis    right hip  . PONV (postoperative nausea and vomiting)   . Pre-diabetes   . Sleep apnea    wears CPAP    PAST SURGICAL HISTORY:   Past Surgical History:  Procedure Laterality Date  .  BACK SURGERY  2014   laser spine  . COLONOSCOPY W/ BIOPSIES AND POLYPECTOMY    . FRACTURE SURGERY  [phone]   right arm, left knee, right knee (rod in right arm, screws in right knee, one screw in left knee)  . KNEE ARTHROPLASTY Left 02/19/2018   Procedure: COMPUTER ASSISTED TOTAL KNEE ARTHROPLASTY;  Surgeon: Dereck Leep, MD;  Location: [facility] ORS;  Service: Orthopedics;  Laterality: Left;  Marland Kitchen MULTIPLE TOOTH EXTRACTIONS    . TOTAL HIP ARTHROPLASTY Right 06/09/2016   Procedure: TOTAL HIP ARTHROPLASTY ANTERIOR APPROACH;  Surgeon: Frederik Pear, MD;  Location: [place];  Service: Orthopedics;  Laterality: Right;    SOCIAL HISTORY:   Social History   Tobacco Use  . Smoking status: Current Some Day Smoker    Types: Cigars  . Smokeless tobacco: Never Used  Substance Use Topics  . Alcohol use: Yes    Comment: social    FAMILY HISTORY:   Family History  Problem Relation Age of Onset  . Lung cancer Father   . Cancer Brother     DRUG ALLERGIES:   Allergies  Allergen Reactions  . Ivp Dye [Iodinated Diagnostic Agents] Anaphylaxis, Swelling and Other (See Comments)    THROAT, LIPS SWELL [Reaction took place in 1983.]  . Metrizamide Anaphylaxis, Swelling and Other (See Comments)    THROAT, LIPS SWELL [Reaction took  place in 1983.] This is a component of ivp dye    REVIEW OF SYSTEMS:   Review of Systems  Constitutional: Positive for malaise/fatigue. Negative for chills, diaphoresis, fever and weight loss.  HENT: Positive for congestion. Negative for sinus pain and sore throat.   Eyes: Negative for blurred vision and double vision.  Respiratory: Negative for cough, sputum production, shortness of breath and wheezing.   Cardiovascular: Negative for chest pain, palpitations and leg swelling.  Gastrointestinal: Positive for melena and nausea. Negative for abdominal pain, blood in stool, constipation, diarrhea, heartburn and vomiting.  Genitourinary: Negative for dysuria, flank  pain and hematuria.  Musculoskeletal: Negative for back pain, falls, joint pain and myalgias.  Skin: Negative for itching and rash.  Neurological: Positive for dizziness and weakness (general). Negative for headaches.  Psychiatric/Behavioral: Negative.  Negative for depression.    MEDICATIONS AT HOME:   Prior to Admission medications   Medication Sig Start Date End Date Taking? Authorizing Provider  amLODipine (NORVASC) 10 MG tablet Take 10 mg by mouth daily.    Yes [provider]  atorvastatin (LIPITOR) 20 MG tablet Take 20 mg by mouth daily.   Yes [provider]  bifidobacterium infantis (ALIGN) capsule Take 1 capsule by mouth daily.   Yes [provider]  fexofenadine (ALLEGRA) 180 MG tablet Take 180 mg by mouth daily.    Yes [provider]  losartan-hydrochlorothiazide (HYZAAR) 50-12.5 MG tablet Take 1 tablet by mouth daily.    Yes [provider]  metFORMIN (GLUCOPHAGE-XR) 750 MG 24 hr tablet Take 750 mg by mouth daily. 01/17/17  Yes [provider]  Multiple Vitamins-Minerals (CENTRUM SILVER 50+MEN PO) Take 1 tablet by mouth daily.   Yes [provider]  Omega-3 Fatty Acids (FISH OIL) 500 MG CAPS Take 500 mg by mouth daily.   Yes [provider]  omeprazole (PRILOSEC) 20 MG capsule Take 20 mg by mouth daily.   Yes [provider]  albuterol (VENTOLIN HFA) 108 (90 Base) MCG/ACT inhaler INHALE 2 INHALATIONS INTO THE LUNGS EVERY 6 HOURS AS NEEDED FOR SEASONAL 05/05/16   [provider]  enoxaparin (LOVENOX) 40 MG/0.4ML injection Inject 0.4 mLs (40 mg total) into the skin daily. Patient not taking: Reported on 01/02/2019 02/21/18   [name], PA-C  naproxen sodium (ALEVE) 220 MG tablet Take 440 mg by mouth daily as needed (for pain or headache).    [provider]  oxyCODONE (OXY IR/ROXICODONE) 5 MG immediate release tablet Take 1 tablet (5 mg total) by mouth every 4 (four) hours as needed for  moderate pain (pain score 4-6). Patient not taking: Reported on 01/02/2019 02/21/18   [name], PA-C  traMADol (ULTRAM) 50 MG tablet Take 1-2 tablets (50-100 mg total) by mouth every 4 (four) hours as needed for moderate pain. Patient not taking: Reported on 01/02/2019 02/21/18   [name], PA-C      VITAL SIGNS:  Blood pressure (!) 117/50, pulse 76, temperature 99.1 F (37.3 C), temperature source Oral, resp. rate 19, height 5\' 11"  (1.803 m), weight 131.5 kg, SpO2 99 %.  PHYSICAL EXAMINATION:  Physical Exam  GENERAL:  58 y.o.-year-old patient lying in the bed with no acute distress.  EYES: Pupils equal, round, reactive to light and accommodation. No scleral icterus. Extraocular muscles intact.  HEENT: Head atraumatic, normocephalic. Oropharynx and nasopharynx clear.  NECK:  Supple, no jugular venous distention. No thyroid enlargement, no tenderness.  LUNGS: Normal breath sounds bilaterally, no wheezing, rales,rhonchi or crepitation.  No use of accessory muscles of respiration.  CARDIOVASCULAR: Regular rate and rhythm, S1, S2 normal. No murmurs, rubs, or gallops.  ABDOMEN: Abdominal obesity, soft, nondistended, nontender. Bowel sounds present. No organomegaly or mass.  EXTREMITIES: No pedal edema, cyanosis, or clubbing.  NEUROLOGIC: Cranial nerves II through XII are intact. Muscle strength 5/5 in all extremities. Sensation intact. Gait not checked.  PSYCHIATRIC: The patient is alert and oriented x 3.  Normal affect and good eye contact. SKIN: Pale, no obvious rash, lesion, or ulcer.   LABORATORY PANEL:   CBC Recent Labs  Lab 01/02/19 1922  WBC 8.4  HGB 5.4*  HCT 17.2*  PLT 174   ------------------------------------------------------------------------------------------------------------------  Chemistries  Recent Labs  Lab 01/02/19 1922  NA 138  K 3.7  CL 109  CO2 20*  GLUCOSE 146*  BUN 31*  CREATININE 0.93  CALCIUM 7.7*  AST 31  ALT 29  ALKPHOS 63  BILITOT 0.6    ------------------------------------------------------------------------------------------------------------------  Cardiac Enzymes No results for input(s): TROPONINI in the last 168 hours. ------------------------------------------------------------------------------------------------------------------  RADIOLOGY:  Dg Chest Portable 1 View  Result Date: 01/02/2019 CLINICAL DATA:  Shortness of breath EXAM: PORTABLE CHEST 1 VIEW COMPARISON:  July 05, 2014 FINDINGS: The heart size and mediastinal contours are within normal limits. Both lungs are clear. The visualized skeletal structures are unremarkable. IMPRESSION: No active cardiopulmonary disease. Electronically Signed   By: Abelardo Diesel M.D.   On: 01/02/2019 20:21      IMPRESSION AND PLAN:   1.  GI bleed - Patient is receiving 2 units packed red blood cells transfusion. -We will repeat hemoglobin hematocrit posttransfusion - Gastroenterology, Dr. Allen Norris, consulted for further evaluation - Patient is being held n.p.o. after midnight for possible intervention - Will hold aspirin 81 mg - Discussed the importance of abstaining from Aleve - We will treat nausea with IV antiemetic  2.  GERD - Patient started on PPI therapy  3.  Hypertension - Patient has a history of hypertension.  However currently he is hypotensive.  Will hold all antihypertensives at this point.  4.  Obstructive sleep apnea - CPAP therapy  5.  Hyperlipidemia -We will restart statin therapy  DVT prophylaxis with SCDs and PPI prophylaxis initiated    All the records are reviewed and case discussed with ED provider. The plan of care was discussed in details with the patient (and family). I answered all questions. The patient agreed to proceed with the above mentioned plan. Further management will depend upon hospital course.   CODE STATUS: Full code  TOTAL TIME TAKING CARE OF THIS PATIENT: 45 minutes.    Connor Brewer Connor Brewer CRNPon 01/03/2019 at 12:04 AM   Pager - [phone]  After 6pm go to [email or web address] - Proofreader  [facility]  Office  [phone]  CC: Primary care physician; Juluis Pitch, MD   Note: This dictation was prepared with Dragon dictation along with smaller phrase technology. Any transcriptional errors that result from this process are unintentional.

## 2019-01-03 NOTE — Progress Notes (Signed)
[name] NAME: Connor Brewer    MR#:  [id number]  DATE OF BIRTH:  1960-09-19  SUBJECTIVE:   Patient states he is feeling much better today compared to yesterday.  He does not feel as weak or fatigued as he did when he first came in.  He has not noticed any additional GI bleeding.  No chest pain, palpitations, abdominal pain.  No nausea or vomiting.  REVIEW OF SYSTEMS:  Review of Systems  Constitutional: Negative for chills and fever.  HENT: Negative for congestion and sore throat.   Eyes: Negative for blurred vision and double vision.  Respiratory: Negative for cough and shortness of breath.   Cardiovascular: Negative for chest pain and palpitations.  Gastrointestinal: Positive for blood in stool. Negative for nausea and vomiting.  Genitourinary: Negative for dysuria, hematuria and urgency.  Musculoskeletal: Negative for back pain and neck pain.  Neurological: Negative for dizziness and headaches.  Psychiatric/Behavioral: Negative for depression. The patient is not nervous/anxious.    DRUG ALLERGIES:   Allergies  Allergen Reactions  . Ivp Dye [Iodinated Diagnostic Agents] Anaphylaxis, Swelling and Other (See Comments)    THROAT, LIPS SWELL [Reaction took place in 1983.]  . Metrizamide Anaphylaxis, Swelling and Other (See Comments)    THROAT, LIPS SWELL [Reaction took place in 1983.] This is a component of ivp dye   VITALS:  Blood pressure 133/68, pulse 70, temperature (!) 96.4 F (35.8 C), temperature source Tympanic, resp. rate 16, height 5\' 11"  (1.803 m), weight 136.1 kg, SpO2 100 %. PHYSICAL EXAMINATION:  Physical Exam  GENERAL:  Laying in the bed with no acute distress.  HEENT: Head atraumatic, normocephalic. Pupils equal, round, reactive to light and accommodation. No scleral icterus. Extraocular muscles intact. Oropharynx and nasopharynx clear. + Conjunctival pallor NECK:  Supple, no jugular venous distention. No  thyroid enlargement. LUNGS: Lungs are clear to auscultation bilaterally. No wheezes, crackles, rhonchi. No use of accessory muscles of respiration.  CARDIOVASCULAR: RRR, S1, S2 normal. No murmurs, rubs, or gallops.  ABDOMEN: Soft, nontender, nondistended. Bowel sounds present.  EXTREMITIES: No pedal edema, cyanosis, or clubbing.  NEUROLOGIC: CN 2-12 intact, no focal deficits. 5/5 muscle strength throughout all extremities. Sensation intact throughout. Gait not checked.  PSYCHIATRIC: The patient is alert and oriented x 3.  SKIN: No obvious rash, lesion, or ulcer.  LABORATORY PANEL:  Male CBC Recent Labs  Lab 01/03/19 0542  WBC 11.8*  HGB 7.3*  HCT 22.5*  PLT 169   ------------------------------------------------------------------------------------------------------------------ Chemistries  Recent Labs  Lab 01/02/19 1922 01/03/19 0542  NA 138 139  K 3.7 3.8  CL 109 107  CO2 20* 25  GLUCOSE 146* 132*  BUN 31* 33*  CREATININE 0.93 0.98  CALCIUM 7.7* 8.6*  AST 31  --   ALT 29  --   ALKPHOS 63  --   BILITOT 0.6  --    RADIOLOGY:  Dg Chest Portable 1 View  Result Date: 01/02/2019 CLINICAL DATA:  Shortness of breath EXAM: PORTABLE CHEST 1 VIEW COMPARISON:  July 05, 2014 FINDINGS: The heart size and mediastinal contours are within normal limits. Both lungs are clear. The visualized skeletal structures are unremarkable. IMPRESSION: No active cardiopulmonary disease. Electronically Signed   By: Abelardo Diesel M.D.   On: 01/02/2019 20:21   ASSESSMENT AND PLAN:   GI bleed-patient denies any continued active bleeding.  May be due to PUD in the setting of Aleve use. -GI consulted- plan for EGD  today -Holding NSAIDs -Continue IV PPI  Acute blood loss anemia-due to above.   -S/p 2 unit PRBC -Serial H/H  GERD-stable -Continue IV PPI  Hypertension -Holding home BP meds in the setting of GI bleed, will restart as needed  OSA-stable -CPAP nightly  Hyperlipidemia-stable  -Continue home statin  All the records are reviewed and case discussed with Care Management/Social Worker. Management plans discussed with the patient, family and they are in agreement.  CODE STATUS: Full Code  TOTAL TIME TAKING CARE OF THIS PATIENT: 40 minutes.   More than 50% of the time was spent in counseling/coordination of care: YES  POSSIBLE D/C IN 1-2 DAYS, DEPENDING ON CLINICAL CONDITION.   Connor Brewer M.D on 01/03/2019 at 1:15 PM  Between 7am to 6pm - Pager - [phone]  After 6pm go to [email or web address] - Proofreader  [facility]  Office  [phone]  CC: Primary care physician; Connor Pitch, MD  Note: This dictation was prepared with Dragon dictation along with smaller phrase technology. Any transcriptional errors that result from this process are unintentional.

## 2019-01-03 NOTE — Anesthesia Preprocedure Evaluation (Addendum)
Anesthesia Evaluation  Patient identified by MRN, date of birth, ID band Patient awake    Reviewed: Allergy & Precautions, NPO status , Patient's Chart, lab work & pertinent test results  History of Anesthesia Complications (+) PONV and history of anesthetic complications  Airway Mallampati: III       Dental   Pulmonary sleep apnea and Continuous Positive Airway Pressure Ventilation , COPD,  COPD inhaler, Current Smoker,    Pulmonary exam normal        Cardiovascular hypertension, Pt. on medications (-) Past MI and (-) CHF Normal cardiovascular exam(-) dysrhythmias (-) Valvular Problems/Murmurs     Neuro/Psych neg Seizures    GI/Hepatic Neg liver ROS, GERD  Medicated and Controlled,  Endo/Other  diabetes, Type 2, Oral Hypoglycemic AgentsMorbid obesity  Renal/GU negative Renal ROS  negative genitourinary   Musculoskeletal  (+) Arthritis , Osteoarthritis,    Abdominal Normal abdominal exam  (+)   Peds negative pediatric ROS (+)  Hematology   Anesthesia Other Findings   Reproductive/Obstetrics                            Anesthesia Physical  Anesthesia Plan  ASA: III  Anesthesia Plan: General   Post-op Pain Management:    Induction: Intravenous  PONV Risk Score and Plan: Propofol infusion  Airway Management Planned: Nasal Cannula  Additional Equipment:   Intra-op Plan:   Post-operative Plan:   Informed Consent: I have reviewed the patients History and Physical, chart, labs and discussed the procedure including the risks, benefits and alternatives for the proposed anesthesia with the patient or authorized representative who has indicated his/her understanding and acceptance.       Plan Discussed with: CRNA and Surgeon  Anesthesia Plan Comments:         Anesthesia Quick Evaluation

## 2019-01-03 NOTE — Consult Note (Signed)
Lucilla Lame, MD [facility]  [address], [place], [place] [address] Phone: [phone] Fax : [phone]  Consultation  Referring Provider:     Gardiner Barefoot, NP Primary Care Physician:  Juluis Pitch, MD Primary Gastroenterologist:  Dr. Gustavo Lah         Reason for Consultation:     Melena  Date of Admission:  01/02/2019 Date of Consultation:  01/03/2019         HPI:   Connor Brewer is a 58 y.o. male who came to the hospital with fatigue and reports some black stools with some blood mixed with it.  The patient states that he had black stools for the last few days and thinks it started about 5 days ago with what he describes as just black stools with some blood in the toilet also.  Patient had a colonoscopy back in 2015 with multiple polyps found including tubular adenomas.  He reports that at that time the colonoscopy was not complete and he had to go for a barium enema.  The patient reports that that was very uncomfortable and he would rather never have to do that again.  The patient had the barium enema because of the inability for the gastroenterologist to pass the scope to the cecum. The patient barium enema showed right sided diverticuli with no focal masses seen. The patient's hemoglobin on admission was 5.4 which was down from 15.5 in August 2019.  This morning after blood transfusion the hemoglobin is 7.3.  The patient denies any abdominal pain but he does state that he takes NSAIDs alternating with Tylenol for pain.  He also reports that he takes a daily aspirin.  Past Medical History:  Diagnosis Date  . Bronchitis    " aalergy driven"  . DVT (deep venous thrombosis) (HCC)    LLE  . GERD (gastroesophageal reflux disease)   . H/O seasonal allergies   . Hypertension   . Obesity   . Osteoarthritis    right hip  . PONV (postoperative nausea and vomiting)   . Pre-diabetes   . Sleep apnea    wears CPAP    Past Surgical History:  Procedure Laterality Date  . BACK  SURGERY  2014   laser spine  . COLONOSCOPY W/ BIOPSIES AND POLYPECTOMY    . FRACTURE SURGERY  [phone]   right arm, left knee, right knee (rod in right arm, screws in right knee, one screw in left knee)  . KNEE ARTHROPLASTY Left 02/19/2018   Procedure: COMPUTER ASSISTED TOTAL KNEE ARTHROPLASTY;  Surgeon: Dereck Leep, MD;  Location: [facility];  Service: Orthopedics;  Laterality: Left;  Marland Kitchen MULTIPLE TOOTH EXTRACTIONS    . TOTAL HIP ARTHROPLASTY Right 06/09/2016   Procedure: TOTAL HIP ARTHROPLASTY ANTERIOR APPROACH;  Surgeon: Frederik Pear, MD;  Location: [facility];  Service: Orthopedics;  Laterality: Right;    Prior to Admission medications   Medication Sig Start Date End Date Taking? Authorizing Provider  amLODipine (NORVASC) 10 MG tablet Take 10 mg by mouth daily.    Yes [provider]  atorvastatin (LIPITOR) 20 MG tablet Take 20 mg by mouth daily.   Yes [provider]  bifidobacterium infantis (ALIGN) capsule Take 1 capsule by mouth daily.   Yes [provider]  fexofenadine (ALLEGRA) 180 MG tablet Take 180 mg by mouth daily.    Yes [provider]  losartan-hydrochlorothiazide (HYZAAR) 50-12.5 MG tablet Take 1 tablet by mouth daily.    Yes [provider]  metFORMIN (GLUCOPHAGE-XR) 750 MG 24 hr tablet Take 750 mg by mouth daily. 01/17/17  Yes [provider]  Multiple Vitamins-Minerals (CENTRUM SILVER 50+MEN PO) Take 1 tablet by mouth daily.   Yes [provider]  Omega-3 Fatty Acids (FISH OIL) 500 MG CAPS Take 500 mg by mouth daily.   Yes [provider]  omeprazole (PRILOSEC) 20 MG capsule Take 20 mg by mouth daily.   Yes [provider]  albuterol (VENTOLIN HFA) 108 (90 Base) MCG/ACT inhaler INHALE 2 INHALATIONS INTO THE LUNGS EVERY 6 HOURS AS NEEDED FOR SEASONAL 05/05/16   [provider]  enoxaparin (LOVENOX) 40 MG/0.4ML injection Inject 0.4 mLs (40 mg total) into the skin daily. Patient not  taking: Reported on 01/02/2019 02/21/18   [name], PA-C  naproxen sodium (ALEVE) 220 MG tablet Take 440 mg by mouth daily as needed (for pain or headache).    [provider]  oxyCODONE (OXY IR/ROXICODONE) 5 MG immediate release tablet Take 1 tablet (5 mg total) by mouth every 4 (four) hours as needed for moderate pain (pain score 4-6). Patient not taking: Reported on 01/02/2019 02/21/18   [name], PA-C  traMADol (ULTRAM) 50 MG tablet Take 1-2 tablets (50-100 mg total) by mouth every 4 (four) hours as needed for moderate pain. Patient not taking: Reported on 01/02/2019 02/21/18   [name], PA-C    Family History  Problem Relation Age of Onset  . Lung cancer Father   . Cancer Brother      Social History   Tobacco Use  . Smoking status: Current Some Day Smoker    Types: Cigars  . Smokeless tobacco: Never Used  Substance Use Topics  . Alcohol use: Yes    Comment: social  . Drug use: No    Allergies as of 01/02/2019 - Review Complete 01/02/2019  Allergen Reaction Noted  . Ivp dye [iodinated diagnostic agents] Anaphylaxis, Swelling, and Other (See Comments) 05/26/2016  . Metrizamide Anaphylaxis, Swelling, and Other (See Comments) 05/26/2016    Review of Systems:    All systems reviewed and negative except where noted in HPI.   Physical Exam:  Vital signs in last 24 hours: Temp:  [98.4 F (36.9 C)-99.4 F (37.4 C)] 98.4 F (36.9 C) (07/06 0313) Pulse Rate:  [74-90] 77 (07/06 0313) Resp:  [16-28] 18 (07/06 0313) BP: (97-159)/(47-79) 130/65 (07/06 0313) SpO2:  [97 %-100 %] 99 % (07/06 0313) Weight:  [131.5 kg-136 kg] 136 kg (07/05 2342) Last BM Date: 01/02/19 General:   Pleasant, cooperative in NAD Head:  Normocephalic and atraumatic. Eyes:   No icterus.   Conjunctiva pink. PERRLA. Ears:  Normal auditory acuity. Neck:  Supple; no masses or thyroidomegaly Lungs: Respirations even and unlabored. Lungs clear to auscultation bilaterally.   No wheezes, crackles, or  rhonchi.  Heart:  Regular rate and rhythm;  Without murmur, clicks, rubs or gallops Abdomen:  Soft, nondistended, nontender. Normal bowel sounds. No appreciable masses or hepatomegaly.  No rebound or guarding.  Rectal:  Not performed. Msk:  Symmetrical without gross deformities.    Extremities:  Without edema, cyanosis or clubbing. Neurologic:  Alert and oriented x3;  grossly normal neurologically. Skin:  Intact without significant lesions or rashes. Cervical Nodes:  No significant cervical adenopathy. Psych:  Alert and cooperative. Normal affect.  LAB RESULTS: Recent Labs    01/02/19 1922 01/03/19 0542  WBC 8.4 11.8*  HGB 5.4* 7.3*  HCT 17.2* 22.5*  PLT 174 169   BMET Recent Labs  01/02/19 1922 01/03/19 0542  NA 138 139  K 3.7 3.8  CL 109 107  CO2 20* 25  GLUCOSE 146* 132*  BUN 31* 33*  CREATININE 0.93 0.98  CALCIUM 7.7* 8.6*   LFT Recent Labs    01/02/19 1922  PROT 5.2*  ALBUMIN 2.8*  AST 31  ALT 29  ALKPHOS 63  BILITOT 0.6   PT/INR Recent Labs    01/02/19 1922 01/03/19 0542  LABPROT 12.6 12.6  INR 1.0 1.0    STUDIES: Dg Chest Portable 1 View  Result Date: 01/02/2019 CLINICAL DATA:  Shortness of breath EXAM: PORTABLE CHEST 1 VIEW COMPARISON:  July 05, 2014 FINDINGS: The heart size and mediastinal contours are within normal limits. Both lungs are clear. The visualized skeletal structures are unremarkable. IMPRESSION: No active cardiopulmonary disease. Electronically Signed   By: Abelardo Diesel M.D.   On: 01/02/2019 20:21      Impression / Plan:   Assessment: Active Problems:   GI bleed   Connor Brewer is a 58 y.o. y/o male with profound anemia with a hemoglobin of 5.4 and dark stools with blood in them.  The patient has a history of adenomatous polyps 5 years ago and has not had a repeat colonoscopy.  The patient has also been on NSAIDs.  Plan:  The patient will be set up for a upper endoscopy for today.  The patient will need a colonoscopy  if the upper endoscopy does not show the likely source of the patient's bleeding.  The patient has been explained the plan and agrees with it.  If this is NSAID induced ulcer disease or gastritis the patient will need to avoid NSAIDs.    Thank you for involving me in the care of this patient.      LOS: 1 day   Lucilla Lame, MD  01/03/2019, 1:04 PM    Note: This dictation was prepared with Dragon dictation along with smaller phrase technology. Any transcriptional errors that result from this process are unintentional.

## 2019-01-03 NOTE — Transfer of Care (Signed)
Immediate Anesthesia Transfer of Care Note  Patient: [name]  Procedure(s) Performed: ESOPHAGOGASTRODUODENOSCOPY (EGD) WITH PROPOFOL (N/A )  Patient Location: PACU  Anesthesia Type:General  Level of Consciousness: sedated  Airway & Oxygen Therapy: Patient Spontanous Breathing and Patient connected to nasal cannula oxygen  Post-op Assessment: Report given to RN and Post -op Vital signs reviewed and stable  Post vital signs: Reviewed and stable  Last Vitals:  Vitals Value Taken Time  BP 111/61 01/03/19 1403  Temp 36.6 C 01/03/19 1403  Pulse 74 01/03/19 1403  Resp 18 01/03/19 1403  SpO2 99 % 01/03/19 1403  Vitals shown include unvalidated device data.  Last Pain:  Vitals:   01/03/19 1403  TempSrc:   PainSc: 0-No pain         Complications: No apparent anesthesia complications

## 2019-01-03 NOTE — Op Note (Signed)
[facility] Gastroenterology Patient Name: Connor Brewer Procedure Date: 01/03/2019 1:36 PM MRN: [id number] Account #: [id number] Date of Birth: [birth year] Admit Type: Inpatient Age: 58 Room: [facility] ENDO ROOM 4 Gender: Male Note Status: Finalized Procedure:            Upper GI endoscopy Indications:          Acute post hemorrhagic anemia Providers:            Lucilla Lame MD, MD Medicines:            Propofol per Anesthesia Complications:        No immediate complications. Procedure:            Pre-Anesthesia Assessment:                       - Prior to the procedure, a History and Physical was                        performed, and patient medications and allergies were                        reviewed. The patient's tolerance of previous                        anesthesia was also reviewed. The risks and benefits of                        the procedure and the sedation options and risks were                        discussed with the patient. All questions were                        answered, and informed consent was obtained. Prior                        Anticoagulants: The patient has taken no previous                        anticoagulant or antiplatelet agents. ASA Grade                        Assessment: II - A patient with mild systemic disease.                        After reviewing the risks and benefits, the patient was                        deemed in satisfactory condition to undergo the                        procedure.                       After obtaining informed consent, the endoscope was                        passed under direct vision. Throughout the procedure,                        the patient's blood  pressure, pulse, and oxygen                        saturations were monitored continuously. The Endoscope                        was introduced through the mouth, and advanced to the                        second part of duodenum. The upper GI endoscopy  was                        accomplished without difficulty. The patient tolerated                        the procedure well. Findings:      The examined esophagus was normal.      The stomach was normal.      Diffuse mildly erythematous mucosa without active bleeding and with no       stigmata of bleeding was found in the duodenal bulb. Impression:           - Normal esophagus.                       - Normal stomach.                       - Erythematous duodenopathy.                       - No specimens collected. Recommendation:       - Return patient to hospital ward for ongoing care.                       - Clear liquid diet.                       - Continue present medications.                       - Perform a colonoscopy tomorrow. Procedure Code(s):    --- Professional ---                       [phone], Esophagogastroduodenoscopy, flexible, transoral;                        diagnostic, including collection of specimen(s) by                        brushing or washing, when performed (separate procedure) Diagnosis Code(s):    --- Professional ---                       D62, Acute posthemorrhagic anemia                       K31.89, Other diseases of stomach and duodenum CPT copyright 2019 American Medical Association. All rights reserved. The codes documented in this report are preliminary and upon coder review may  be revised to meet current compliance requirements. Lucilla Lame MD, MD 01/03/2019 2:04:41 PM This report has been signed electronically. Number of Addenda: 0 Note Initiated On: 01/03/2019 1:36 PM Estimated Blood Loss:  Estimated blood loss: none.      [facility]

## 2019-01-03 NOTE — Anesthesia Postprocedure Evaluation (Signed)
Anesthesia Post Note  Patient: Connor Brewer  Procedure(s) Performed: ESOPHAGOGASTRODUODENOSCOPY (EGD) WITH PROPOFOL (N/A )  Patient location during evaluation: Endoscopy Anesthesia Type: General Level of consciousness: awake and alert Pain management: pain level controlled Vital Signs Assessment: post-procedure vital signs reviewed and stable Respiratory status: spontaneous breathing, nonlabored ventilation, respiratory function stable and patient connected to nasal cannula oxygen Cardiovascular status: blood pressure returned to baseline and stable Postop Assessment: no apparent nausea or vomiting Anesthetic complications: no     Last Vitals:  Vitals:   01/03/19 1403 01/03/19 1413  BP: 111/61 130/70  Pulse: 74 79  Resp: 18 13  Temp: 36.6 C   SpO2: 99% 98%    Last Pain:  Vitals:   01/03/19 1413  TempSrc:   PainSc: 0-No pain                 Precious Haws Jerman Tinnon

## 2019-01-03 NOTE — Anesthesia Post-op Follow-up Note (Signed)
Anesthesia QCDR form completed.        

## 2019-01-04 ENCOUNTER — Encounter
Admission: EM | Disposition: A | Discharge status: Home / Self Care | Payer: Self-pay | Source: Home / Self Care | Attending: Internal Medicine

## 2019-01-04 ENCOUNTER — Inpatient Hospital Stay: Payer: Managed Care, Other (non HMO) | Admitting: Anesthesiology

## 2019-01-04 ENCOUNTER — Encounter: Payer: Self-pay | Admitting: *Deleted

## 2019-01-04 DIAGNOSIS — K635 Polyp of colon: Secondary | ICD-10-CM

## 2019-01-04 DIAGNOSIS — K921 Melena: Secondary | ICD-10-CM

## 2019-01-04 DIAGNOSIS — D125 Benign neoplasm of sigmoid colon: Secondary | ICD-10-CM

## 2019-01-04 HISTORY — PX: COLONOSCOPY WITH PROPOFOL: SHX5780

## 2019-01-04 LAB — BASIC METABOLIC PANEL
Anion gap: 5 (ref 5–15)
BUN: 24 mg/dL — ABNORMAL HIGH (ref 6–20)
CO2: 24 mmol/L (ref 22–32)
Calcium: 8.2 mg/dL — ABNORMAL LOW (ref 8.9–10.3)
Chloride: 111 mmol/L (ref 98–111)
Creatinine, Ser: 1.17 mg/dL (ref 0.61–1.24)
GFR calc Af Amer: >60 mL/min (ref >60)
GFR calc non Af Amer: >60 mL/min (ref >60)
Glucose, Bld: 149 mg/dL — ABNORMAL HIGH (ref 70–99)
Potassium: 3.7 mmol/L (ref 3.5–5.1)
Sodium: 140 mmol/L (ref 135–145)

## 2019-01-04 LAB — CBC
HCT: 21.9 % — ABNORMAL LOW (ref 39.0–52.0)
Hemoglobin: 7 g/dL — ABNORMAL LOW (ref 13.0–17.0)
MCH: 29.9 pg (ref 26.0–34.0)
MCHC: 32 g/dL (ref 30.0–36.0)
MCV: 93.6 fL (ref 80.0–100.0)
Platelets: 168 10*3/uL (ref 150–400)
RBC: 2.34 MIL/uL — ABNORMAL LOW (ref 4.22–5.81)
RDW: 15.8 % — ABNORMAL HIGH (ref 11.5–15.5)
WBC: 8.4 10*3/uL (ref 4.0–10.5)
nRBC: 0.2 % (ref 0.0–0.2)

## 2019-01-04 LAB — PREPARE RBC (CROSSMATCH)

## 2019-01-04 LAB — HIV ANTIBODY (ROUTINE TESTING W REFLEX): HIV Screen 4th Generation wRfx: NONREACTIVE

## 2019-01-04 LAB — HEMOGLOBIN AND HEMATOCRIT, BLOOD
HCT: 21.7 % — ABNORMAL LOW (ref 39.0–52.0)
Hemoglobin: 7 g/dL — ABNORMAL LOW (ref 13.0–17.0)

## 2019-01-04 SURGERY — COLONOSCOPY WITH PROPOFOL
Anesthesia: General

## 2019-01-04 MED ORDER — SODIUM CHLORIDE 0.9% IV SOLUTION
Freq: Once | INTRAVENOUS | Status: AC
  Administered 2019-01-04: 14:00:00 via INTRAVENOUS

## 2019-01-04 MED ORDER — MIDAZOLAM HCL 2 MG/2ML IJ SOLN
INTRAMUSCULAR | Status: AC
  Filled 2019-01-04: qty 2

## 2019-01-04 MED ORDER — LIDOCAINE HCL (CARDIAC) PF 100 MG/5ML IV SOSY
PREFILLED_SYRINGE | INTRAVENOUS | Status: DC | PRN
  Administered 2019-01-04: 80 mg via INTRATRACHEAL

## 2019-01-04 MED ORDER — PANTOPRAZOLE SODIUM 40 MG PO TBEC
40.0000 mg | DELAYED_RELEASE_TABLET | Freq: Two times a day (BID) | ORAL | Status: DC
  Administered 2019-01-04 – 2019-01-09 (×10): 40 mg via ORAL
  Filled 2019-01-04 (×11): qty 1

## 2019-01-04 MED ORDER — PROPOFOL 500 MG/50ML IV EMUL
INTRAVENOUS | Status: DC | PRN
  Administered 2019-01-04: 140 ug/kg/min via INTRAVENOUS

## 2019-01-04 MED ORDER — PROPOFOL 10 MG/ML IV BOLUS
INTRAVENOUS | Status: DC | PRN
  Administered 2019-01-04: 80 mg via INTRAVENOUS
  Administered 2019-01-04: 27 mg via INTRAVENOUS

## 2019-01-04 NOTE — Transfer of Care (Signed)
Immediate Anesthesia Transfer of Care Note  Patient: Connor Brewer  Procedure(s) Performed: COLONOSCOPY WITH PROPOFOL (N/A )  Patient Location: PACU and Endoscopy Unit  Anesthesia Type:General  Level of Consciousness: awake, alert , oriented and patient cooperative  Airway & Oxygen Therapy: Patient Spontanous Breathing and Patient connected to face mask oxygen  Post-op Assessment: Report given to RN and Post -op Vital signs reviewed and stable  Post vital signs: Reviewed and stable  Last Vitals:  Vitals Value Taken Time  BP 124/55 01/04/19 1158  Temp 36.2 C 01/04/19 1157  Pulse 72 01/04/19 1159  Resp 17 01/04/19 1159  SpO2 100 % 01/04/19 1159  Vitals shown include unvalidated device data.  Last Pain:  Vitals:   01/04/19 1157  TempSrc: Tympanic  PainSc: 0-No pain         Complications: No apparent anesthesia complications

## 2019-01-04 NOTE — Anesthesia Preprocedure Evaluation (Signed)
Anesthesia Evaluation  Patient identified by MRN, date of birth, ID band Patient awake    Reviewed: Allergy & Precautions, NPO status , Patient's Chart, lab work & pertinent test results, reviewed documented beta blocker date and time   History of Anesthesia Complications (+) PONV  Airway Mallampati: II  TM Distance: >3 FB     Dental  (+) Chipped   Pulmonary sleep apnea , Current Smoker,           Cardiovascular hypertension, Pt. on medications      Neuro/Psych    GI/Hepatic GERD  ,  Endo/Other  diabetes, Type 2  Renal/GU      Musculoskeletal  (+) Arthritis ,   Abdominal   Peds  Hematology  (+) anemia ,   Anesthesia Other Findings Obese. Hb 7.3.  Reproductive/Obstetrics                             Anesthesia Physical Anesthesia Plan  ASA: III  Anesthesia Plan: General   Post-op Pain Management:    Induction: Intravenous  PONV Risk Score and Plan:   Airway Management Planned:   Additional Equipment:   Intra-op Plan:   Post-operative Plan:   Informed Consent: I have reviewed the patients History and Physical, chart, labs and discussed the procedure including the risks, benefits and alternatives for the proposed anesthesia with the patient or authorized representative who has indicated his/her understanding and acceptance.       Plan Discussed with: CRNA  Anesthesia Plan Comments:         Anesthesia Quick Evaluation

## 2019-01-04 NOTE — Op Note (Signed)
[facility] Gastroenterology Patient Name: Connor Brewer Procedure Date: 01/04/2019 11:28 AM MRN: [id number] Account #: [id number] Date of Birth: 1961-04-27 Admit Type: Inpatient Age: 58 Room: [facility] ENDO ROOM 4 Gender: Male Note Status: Finalized Procedure:            Colonoscopy Indications:          Hematochezia Providers:            Lucilla Lame MD, MD Medicines:            Propofol per Anesthesia Complications:        No immediate complications. Procedure:            Pre-Anesthesia Assessment:                       - Prior to the procedure, a History and Physical was                        performed, and patient medications and allergies were                        reviewed. The patient's tolerance of previous                        anesthesia was also reviewed. The risks and benefits of                        the procedure and the sedation options and risks were                        discussed with the patient. All questions were                        answered, and informed consent was obtained. Prior                        Anticoagulants: The patient has taken no previous                        anticoagulant or antiplatelet agents. ASA Grade                        Assessment: III - A patient with severe systemic                        disease. After reviewing the risks and benefits, the                        patient was deemed in satisfactory condition to undergo                        the procedure.                       After obtaining informed consent, the colonoscope was                        passed under direct vision. Throughout the procedure,                        the patient's blood pressure, pulse, and oxygen  saturations were monitored continuously. The                        Colonoscope was introduced through the anus and                        advanced to the the cecum, identified by appendiceal   orifice and ileocecal valve. The colonoscopy was                        performed without difficulty. The patient tolerated the                        procedure well. The quality of the bowel preparation                        was fair. Findings:      The perianal and digital rectal examinations were normal.      Three sessile polyps were found in the sigmoid colon. The polyps were 3       to 4 mm in size. These polyps were removed with a cold biopsy forceps.       Resection and retrieval were complete.      A few small-mouthed diverticula were found in the sigmoid colon.      Non-bleeding internal hemorrhoids were found during retroflexion. The       hemorrhoids were Grade II (internal hemorrhoids that prolapse but reduce       spontaneously).      There was no sign of any active bleeding. There was more dark material       in the left colon then the right. Impression:           - Preparation of the colon was fair.                       - Three 3 to 4 mm polyps in the sigmoid colon, removed                        with a cold biopsy forceps. Resected and retrieved.                       - Diverticulosis in the sigmoid colon.                       - Non-bleeding internal hemorrhoids.                       - There was no sign of any active bleeding. There was                        more dark material in the left colon then the right. Recommendation:       - Return patient to hospital ward for ongoing care.                       - Resume previous diet.                       - Continue present medications.                       -  Await pathology results.                       - Repeat colonoscopy in 4 years for surveillance. Procedure Code(s):    --- Professional ---                       [phone], Colonoscopy, flexible; with biopsy, single or                        multiple Diagnosis Code(s):    --- Professional ---                       K92.1, Melena (includes Hematochezia)                        K63.5, Polyp of colon CPT copyright 2019 American Medical Association. All rights reserved. The codes documented in this report are preliminary and upon coder review may  be revised to meet current compliance requirements. Lucilla Lame MD, MD 01/04/2019 11:55:35 AM This report has been signed electronically. Number of Addenda: 0 Note Initiated On: 01/04/2019 11:28 AM Scope Withdrawal Time: 0 hours 11 minutes 41 seconds  Total Procedure Duration: 0 hours 18 minutes 31 seconds  Estimated Blood Loss: Estimated blood loss: none.      [facility]

## 2019-01-04 NOTE — Progress Notes (Signed)
[place] at [place] NAME: Connor Brewer    MR#:  [id number]  DATE OF BIRTH:  [birth year]  SUBJECTIVE:   Patient states he is feeling fine today.  He has not noticed any additional bright red blood in his stool.  He does state he has had a lot of "liquid and watery" stools due to the colonoscopy prep.  No nausea or vomiting.  No abdominal pain.  REVIEW OF SYSTEMS:  Review of Systems  Constitutional: Negative for chills and fever.  HENT: Negative for congestion and sore throat.   Eyes: Negative for blurred vision and double vision.  Respiratory: Negative for cough and shortness of breath.   Cardiovascular: Negative for chest pain and palpitations.  Gastrointestinal: Positive for blood in stool. Negative for nausea and vomiting.  Genitourinary: Negative for dysuria, hematuria and urgency.  Musculoskeletal: Negative for back pain and neck pain.  Neurological: Negative for dizziness and headaches.  Psychiatric/Behavioral: Negative for depression. The patient is not nervous/anxious.    DRUG ALLERGIES:   Allergies  Allergen Reactions  . Ivp Dye [Iodinated Diagnostic Agents] Anaphylaxis, Swelling and Other (See Comments)    THROAT, LIPS SWELL [Reaction took place in 1983.]  . Metrizamide Anaphylaxis, Swelling and Other (See Comments)    THROAT, LIPS SWELL [Reaction took place in 1983.] This is a component of ivp dye   VITALS:  Blood pressure 130/67, pulse 75, temperature (!) 97.2 F (36.2 C), temperature source Tympanic, resp. rate 18, height 5\' 11"  (1.803 m), weight 136.1 kg, SpO2 99 %. PHYSICAL EXAMINATION:  Physical Exam  GENERAL:  Laying in the bed with no acute distress.  HEENT: Head atraumatic, normocephalic. Pupils equal, round, reactive to light and accommodation. No scleral icterus. Extraocular muscles intact. Oropharynx and nasopharynx clear. + Conjunctival pallor NECK:  Supple, no jugular venous distention. No thyroid  enlargement. LUNGS: Lungs are clear to auscultation bilaterally. No wheezes, crackles, rhonchi. No use of accessory muscles of respiration.  CARDIOVASCULAR: RRR, S1, S2 normal. No murmurs, rubs, or gallops.  ABDOMEN: Soft, nontender, nondistended. Bowel sounds present.  EXTREMITIES: No pedal edema, cyanosis, or clubbing.  NEUROLOGIC: CN 2-12 intact, no focal deficits. 5/5 muscle strength throughout all extremities. Sensation intact throughout. Gait not checked.  PSYCHIATRIC: The patient is alert and oriented x 3.  SKIN: No obvious rash, lesion, or ulcer.  LABORATORY PANEL:  Male CBC Recent Labs  Lab 01/04/19 0423  WBC 8.4  HGB 7.0*  HCT 21.9*  PLT 168   ------------------------------------------------------------------------------------------------------------------ Chemistries  Recent Labs  Lab 01/02/19 1922  01/04/19 0423  NA 138   < > 140  K 3.7   < > 3.7  CL 109   < > 111  CO2 20*   < > 24  GLUCOSE 146*   < > 149*  BUN 31*   < > 24*  CREATININE 0.93   < > 1.17  CALCIUM 7.7*   < > 8.2*  AST 31  --   --   ALT 29  --   --   ALKPHOS 63  --   --   BILITOT 0.6  --   --    < > = values in this interval not displayed.   RADIOLOGY:  No results found. ASSESSMENT AND PLAN:   GI bleed- patient denies any continued active bleeding. Likely diverticular bleed. -EGD 7/6 was unremarkable -GI following- plan for colonoscopy today -Holding NSAIDs -Change from IV to p.o. PPI  Acute blood loss  anemia-due to above.  Hemoglobin 7.0 today. -S/p 2 unit PRBC -Will give another unit PRBC today  GERD-stable -Change from IV to p.o. PPI  Hypertension -Holding home BP meds in the setting of GI bleed, will restart as needed  OSA-stable -CPAP nightly  Hyperlipidemia-stable -Continue home statin  Plan for likely discharge home tomorrow if Hgb is stable.  All the records are reviewed and case discussed with Care Management/Social Worker. Management plans discussed with the  patient, family and they are in agreement.  CODE STATUS: Full Code  TOTAL TIME TAKING CARE OF THIS PATIENT: 38 minutes.   More than 50% of the time was spent in counseling/coordination of care: YES  POSSIBLE D/C IN 1-2 DAYS, DEPENDING ON CLINICAL CONDITION.   Berna Spare Mayo M.D on 01/04/2019 at 1:42 PM  Between 7am to 6pm - Pager [phone]  After 6pm go to [email or web address] - Proofreader  [facility]  Office  [phone]  CC: Primary care physician; Juluis Pitch, MD  Note: This dictation was prepared with Dragon dictation along with smaller phrase technology. Any transcriptional errors that result from this process are unintentional.

## 2019-01-04 NOTE — Anesthesia Postprocedure Evaluation (Signed)
Anesthesia Post Note  Patient: Connor Brewer  Procedure(s) Performed: COLONOSCOPY WITH PROPOFOL (N/A )  Anesthesia Type: General     Last Vitals:  Vitals:   01/04/19 0507 01/04/19 1157  BP: (!) 149/72 (!) 124/55  Pulse: 66 75  Resp: 16 18  Temp: 36.9 C (!) 36.2 C  SpO2: 97% 99%    Last Pain:  Vitals:   01/04/19 1157  TempSrc: Tympanic  PainSc: 0-No pain                 Starling Christofferson Fletcher-Harrison

## 2019-01-04 NOTE — Anesthesia Post-op Follow-up Note (Signed)
Anesthesia QCDR form completed.        

## 2019-01-05 ENCOUNTER — Encounter: Payer: Self-pay | Admitting: Gastroenterology

## 2019-01-05 LAB — CBC
HCT: 21 % — ABNORMAL LOW (ref 39.0–52.0)
Hemoglobin: 6.8 g/dL — ABNORMAL LOW (ref 13.0–17.0)
MCH: 30.1 pg (ref 26.0–34.0)
MCHC: 32.4 g/dL (ref 30.0–36.0)
MCV: 92.9 fL (ref 80.0–100.0)
Platelets: 146 10*3/uL — ABNORMAL LOW (ref 150–400)
RBC: 2.26 MIL/uL — ABNORMAL LOW (ref 4.22–5.81)
RDW: 15.3 % (ref 11.5–15.5)
WBC: 7.2 10*3/uL (ref 4.0–10.5)
nRBC: 0.3 % — ABNORMAL HIGH (ref 0.0–0.2)

## 2019-01-05 LAB — TYPE AND SCREEN
ABO/RH(D): B POS
Antibody Screen: NEGATIVE
Unit division: 0
Unit division: 0
Unit division: 0

## 2019-01-05 LAB — BPAM RBC
Blood Product Expiration Date: 202007162359
Blood Product Expiration Date: 202007312359
Blood Product Expiration Date: 202007312359
ISSUE DATE / TIME: 202007052150
ISSUE DATE / TIME: 202007060001
ISSUE DATE / TIME: 202007071411
Unit Type and Rh: 1700
Unit Type and Rh: 7300
Unit Type and Rh: 7300

## 2019-01-05 LAB — HEMOGLOBIN AND HEMATOCRIT, BLOOD
HCT: 21.9 % — ABNORMAL LOW (ref 39.0–52.0)
Hemoglobin: 7.2 g/dL — ABNORMAL LOW (ref 13.0–17.0)

## 2019-01-05 LAB — SURGICAL PATHOLOGY

## 2019-01-05 MED ORDER — HYDRALAZINE HCL 20 MG/ML IJ SOLN: 5.0000 mg | INTRAMUSCULAR | Status: DC | PRN

## 2019-01-05 MED ORDER — AMLODIPINE BESYLATE 10 MG PO TABS
10.0000 mg | ORAL_TABLET | Freq: Every day | ORAL | Status: DC
  Administered 2019-01-05 – 2019-01-09 (×5): 10 mg via ORAL
  Filled 2019-01-05 (×5): qty 1

## 2019-01-05 NOTE — Progress Notes (Signed)
Pt stated RN did not need to update his family. He states he will update them on plan of care. Pt is alert and oriented.

## 2019-01-05 NOTE — Progress Notes (Signed)
[place] at [place] NAME: Connor Brewer    MR#:  [id number]  DATE OF BIRTH:  10/18/1960  SUBJECTIVE:   Patient states he feels good this morning.  He is still notes some overall weakness.  He has not had any additional bloody bowel movements.  No hematuria.  REVIEW OF SYSTEMS:  Review of Systems  Constitutional: Negative for chills and fever.  HENT: Negative for congestion and sore throat.   Eyes: Negative for blurred vision and double vision.  Respiratory: Negative for cough and shortness of breath.   Cardiovascular: Negative for chest pain and palpitations.  Gastrointestinal: Positive for blood in stool. Negative for nausea and vomiting.  Genitourinary: Negative for dysuria, hematuria and urgency.  Musculoskeletal: Negative for back pain and neck pain.  Neurological: Negative for dizziness and headaches.  Psychiatric/Behavioral: Negative for depression. The patient is not nervous/anxious.    DRUG ALLERGIES:   Allergies  Allergen Reactions  . Ivp Dye [Iodinated Diagnostic Agents] Anaphylaxis, Swelling and Other (See Comments)    THROAT, LIPS SWELL [Reaction took place in 1983.]  . Metrizamide Anaphylaxis, Swelling and Other (See Comments)    THROAT, LIPS SWELL [Reaction took place in 1983.] This is a component of ivp dye   VITALS:  Blood pressure (!) 158/72, pulse 74, temperature 98.8 F (37.1 C), temperature source Oral, resp. rate 18, height 5\' 11"  (1.803 m), weight 136.1 kg, SpO2 100 %. PHYSICAL EXAMINATION:  Physical Exam  GENERAL:  Laying in the bed with no acute distress.  HEENT: Head atraumatic, normocephalic. Pupils equal, round, reactive to light and accommodation. No scleral icterus. Extraocular muscles intact. Oropharynx and nasopharynx clear. + Conjunctival pallor NECK:  Supple, no jugular venous distention. No thyroid enlargement. LUNGS: Lungs are clear to auscultation bilaterally. No wheezes, crackles, rhonchi. No  use of accessory muscles of respiration.  CARDIOVASCULAR: RRR, S1, S2 normal. No murmurs, rubs, or gallops.  ABDOMEN: Soft, nontender, nondistended. Bowel sounds present.  EXTREMITIES: No pedal edema, cyanosis, or clubbing.  NEUROLOGIC: CN 2-12 intact, no focal deficits. 5/5 muscle strength throughout all extremities. Sensation intact throughout. Gait not checked.  PSYCHIATRIC: The patient is alert and oriented x 3.  SKIN: No obvious rash, lesion, or ulcer.  LABORATORY PANEL:  Male CBC Recent Labs  Lab 01/05/19 0537  WBC 7.2  HGB 6.8*  HCT 21.0*  PLT 146*   ------------------------------------------------------------------------------------------------------------------ Chemistries  Recent Labs  Lab 01/02/19 1922  01/04/19 0423  NA 138   < > 140  K 3.7   < > 3.7  CL 109   < > 111  CO2 20*   < > 24  GLUCOSE 146*   < > 149*  BUN 31*   < > 24*  CREATININE 0.93   < > 1.17  CALCIUM 7.7*   < > 8.2*  AST 31  --   --   ALT 29  --   --   ALKPHOS 63  --   --   BILITOT 0.6  --   --    < > = values in this interval not displayed.   RADIOLOGY:  No results found. ASSESSMENT AND PLAN:   GI bleed- likely diverticular bleed. -EGD 7/6 was unremarkable -Colonoscopy 7/7 with old blood and some polyps which were removed.  No active bleeding. -GI following- plan for capsule study tomorrow morning due to hemoglobin drop -Holding NSAIDs -Continue p.o. PPI  Acute blood loss anemia-due to above.  Hemoglobin remains at 6.8  today, despite 1 unit PRBC yesterday. -S/p 3 units PRBC -Will give another unit PRBC today -Will stop fluids, in case there is a dilutional component -Recheck H/H at 1400, if Hgb <7 will transfuse another unit of blood  GERD-stable -Continue PPI  Hypertension -Restart Norvasc -Holding losartan-HCTZ  OSA-stable -CPAP nightly  Hyperlipidemia-stable -Continue home statin  Likely discharge home in 1 to 2 days.  All the records are reviewed and case  discussed with Care Management/Social Worker. Management plans discussed with the patient, family and they are in agreement.  CODE STATUS: Full Code  TOTAL TIME TAKING CARE OF THIS PATIENT: 38 minutes.   More than 50% of the time was spent in counseling/coordination of care: YES  POSSIBLE D/C IN 1-2 DAYS, DEPENDING ON CLINICAL CONDITION.   Berna Spare Sheilia Reznick M.D on 01/05/2019 at 12:55 PM  Between 7am to 6pm - Pager - [phone]  After 6pm go to [email or web address] - Proofreader  [facility]  Office  [phone]  CC: Primary care physician; Juluis Pitch, MD  Note: This dictation was prepared with Dragon dictation along with smaller phrase technology. Any transcriptional errors that result from this process are unintentional.

## 2019-01-06 ENCOUNTER — Encounter: Payer: Self-pay | Admitting: Gastroenterology

## 2019-01-06 ENCOUNTER — Encounter
Admission: EM | Disposition: A | Discharge status: Home / Self Care | Payer: Self-pay | Source: Home / Self Care | Attending: Internal Medicine

## 2019-01-06 ENCOUNTER — Encounter: Payer: Self-pay | Admitting: Anesthesiology

## 2019-01-06 DIAGNOSIS — K922 Gastrointestinal hemorrhage, unspecified: Secondary | ICD-10-CM

## 2019-01-06 HISTORY — PX: GIVENS CAPSULE STUDY: SHX5432

## 2019-01-06 LAB — CBC
HCT: 20.2 % — ABNORMAL LOW (ref 39.0–52.0)
Hemoglobin: 6.4 g/dL — ABNORMAL LOW (ref 13.0–17.0)
MCH: 29.8 pg (ref 26.0–34.0)
MCHC: 31.7 g/dL (ref 30.0–36.0)
MCV: 94 fL (ref 80.0–100.0)
Platelets: 156 10*3/uL (ref 150–400)
RBC: 2.15 MIL/uL — ABNORMAL LOW (ref 4.22–5.81)
RDW: 14.8 % (ref 11.5–15.5)
WBC: 6.2 10*3/uL (ref 4.0–10.5)
nRBC: 0.3 % — ABNORMAL HIGH (ref 0.0–0.2)

## 2019-01-06 LAB — HEMOGLOBIN AND HEMATOCRIT, BLOOD
HCT: 26 % — ABNORMAL LOW (ref 39.0–52.0)
Hemoglobin: 8.4 g/dL — ABNORMAL LOW (ref 13.0–17.0)

## 2019-01-06 SURGERY — IMAGING PROCEDURE, GI TRACT, INTRALUMINAL, VIA CAPSULE

## 2019-01-06 MED ORDER — SODIUM CHLORIDE 0.9% IV SOLUTION
Freq: Once | INTRAVENOUS | Status: AC
  Administered 2019-01-06: 12:00:00 via INTRAVENOUS

## 2019-01-06 NOTE — Progress Notes (Signed)
[place] at [place] NAME: Connor Brewer    MR#:  [id number]  DATE OF BIRTH:  Nov 27, 1960  SUBJECTIVE:   Patient states that he is feeling frustrated about his continuing drop in hemoglobin.  He endorses overall fatigue.  He has not had any additional bloody bowel movements.  REVIEW OF SYSTEMS:  Review of Systems  Constitutional: Negative for chills and fever.  HENT: Negative for congestion and sore throat.   Eyes: Negative for blurred vision and double vision.  Respiratory: Negative for cough and shortness of breath.   Cardiovascular: Negative for chest pain and palpitations.  Gastrointestinal: Negative for blood in stool, nausea and vomiting.  Genitourinary: Negative for dysuria, hematuria and urgency.  Musculoskeletal: Negative for back pain and neck pain.  Neurological: Negative for dizziness and headaches.  Psychiatric/Behavioral: Negative for depression. The patient is not nervous/anxious.    DRUG ALLERGIES:   Allergies  Allergen Reactions  . Ivp Dye [Iodinated Diagnostic Agents] Anaphylaxis, Swelling and Other (See Comments)    THROAT, LIPS SWELL [Reaction took place in 1983.]  . Metrizamide Anaphylaxis, Swelling and Other (See Comments)    THROAT, LIPS SWELL [Reaction took place in 1983.] This is a component of ivp dye   VITALS:  Blood pressure (!) 149/75, pulse 65, temperature 98.6 F (37 C), temperature source Oral, resp. rate 18, height 5\' 11"  (1.803 m), weight 136.1 kg, SpO2 99 %. PHYSICAL EXAMINATION:  Physical Exam  GENERAL:  Laying in the bed with no acute distress.  HEENT: Head atraumatic, normocephalic. Pupils equal, round, reactive to light and accommodation. No scleral icterus. Extraocular muscles intact. Oropharynx and nasopharynx clear. + Conjunctival pallor NECK:  Supple, no jugular venous distention. No thyroid enlargement. LUNGS: Lungs are clear to auscultation bilaterally. No wheezes, crackles, rhonchi. No  use of accessory muscles of respiration.  CARDIOVASCULAR: RRR, S1, S2 normal. No murmurs, rubs, or gallops.  ABDOMEN: Soft, nontender, nondistended. Bowel sounds present.  EXTREMITIES: No pedal edema, cyanosis, or clubbing.  NEUROLOGIC: CN 2-12 intact, no focal deficits. 5/5 muscle strength throughout all extremities. Sensation intact throughout. Gait not checked.  PSYCHIATRIC: The patient is alert and oriented x 3.  SKIN: No obvious rash, lesion, or ulcer.  LABORATORY PANEL:  Male CBC Recent Labs  Lab 01/06/19 0255  WBC 6.2  HGB 6.4*  HCT 20.2*  PLT 156   ------------------------------------------------------------------------------------------------------------------ Chemistries  Recent Labs  Lab 01/02/19 1922  01/04/19 0423  NA 138   < > 140  K 3.7   < > 3.7  CL 109   < > 111  CO2 20*   < > 24  GLUCOSE 146*   < > 149*  BUN 31*   < > 24*  CREATININE 0.93   < > 1.17  CALCIUM 7.7*   < > 8.2*  AST 31  --   --   ALT 29  --   --   ALKPHOS 63  --   --   BILITOT 0.6  --   --    < > = values in this interval not displayed.   RADIOLOGY:  No results found. ASSESSMENT AND PLAN:   GI bleed- likely diverticular bleed. -EGD 7/6 was unremarkable -Colonoscopy 7/7 with old blood and some polyps which were removed.  No active bleeding. -GI following -Capsule study today -Holding NSAIDs -Continue p.o. PPI  Acute blood loss anemia-due to above.  Hemoglobin dropped from 7.2 > 6.4 today. -S/p 3 units PRBC -Will give  another unit PRBC today  GERD-stable -Continue PPI  Hypertension -Continue Norvasc -Holding losartan-HCTZ  OSA-stable -CPAP nightly  Hyperlipidemia-stable -Continue home statin  Likely discharge home in 1 to 2 days.  All the records are reviewed and case discussed with Care Management/Social Worker. Management plans discussed with the patient, family and they are in agreement.  CODE STATUS: Full Code  TOTAL TIME TAKING CARE OF THIS PATIENT: 40  minutes.   More than 50% of the time was spent in counseling/coordination of care: YES  POSSIBLE D/C IN 1-2 DAYS, DEPENDING ON CLINICAL CONDITION.   Connor Brewer M.D on 01/06/2019 at 2:44 PM  Between 7am to 6pm - Pager [phone]  After 6pm go to [email or web address] - Proofreader  [facility]  Office  [phone]  CC: Primary care physician; Juluis Pitch, MD  Note: This dictation was prepared with Dragon dictation along with smaller phrase technology. Any transcriptional errors that result from this process are unintentional.

## 2019-01-06 NOTE — Anesthesia Postprocedure Evaluation (Signed)
Anesthesia Post Note  Patient: Connor Brewer  Procedure(s) Performed: COLONOSCOPY WITH PROPOFOL (N/A )  Patient location during evaluation: Endoscopy Anesthesia Type: General Level of consciousness: awake and alert Pain management: pain level controlled Vital Signs Assessment: post-procedure vital signs reviewed and stable Respiratory status: spontaneous breathing, nonlabored ventilation, respiratory function stable and patient connected to nasal cannula oxygen Cardiovascular status: blood pressure returned to baseline and stable Postop Assessment: no apparent nausea or vomiting Anesthetic complications: no     Last Vitals:  Vitals:   01/05/19 2034 01/06/19 0512  BP: (!) 166/74 139/66  Pulse: 68 68  Resp: 20 18  Temp: 36.7 C 36.9 C  SpO2: 100% 97%    Last Pain:  Vitals:   01/06/19 0512  TempSrc: Oral  PainSc:                  Rutledge Selsor S

## 2019-01-07 ENCOUNTER — Inpatient Hospital Stay: Payer: Managed Care, Other (non HMO)

## 2019-01-07 LAB — CBC
HCT: 23.6 % — ABNORMAL LOW (ref 39.0–52.0)
Hemoglobin: 7.5 g/dL — ABNORMAL LOW (ref 13.0–17.0)
MCH: 29.2 pg (ref 26.0–34.0)
MCHC: 31.8 g/dL (ref 30.0–36.0)
MCV: 91.8 fL (ref 80.0–100.0)
Platelets: 176 10*3/uL (ref 150–400)
RBC: 2.57 MIL/uL — ABNORMAL LOW (ref 4.22–5.81)
RDW: 15 % (ref 11.5–15.5)
WBC: 6.4 10*3/uL (ref 4.0–10.5)
nRBC: 0 % (ref 0.0–0.2)

## 2019-01-07 LAB — HEMOGLOBIN AND HEMATOCRIT, BLOOD
HCT: 27.8 % — ABNORMAL LOW (ref 39.0–52.0)
Hemoglobin: 9 g/dL — ABNORMAL LOW (ref 13.0–17.0)

## 2019-01-07 MED ORDER — PEG 3350-KCL-NA BICARB-NACL 420 G PO SOLR
4000.0000 mL | Freq: Once | ORAL | Status: AC
  Administered 2019-01-07: 4000 mL via ORAL
  Filled 2019-01-07: qty 4000

## 2019-01-07 MED ORDER — HYDRALAZINE HCL 20 MG/ML IJ SOLN
10.0000 mg | INTRAMUSCULAR | Status: DC | PRN
  Administered 2019-01-07: 10 mg via INTRAVENOUS
  Filled 2019-01-07: qty 1

## 2019-01-07 MED ORDER — TECHNETIUM TC 99M-LABELED RED BLOOD CELLS IV KIT
25.0000 | PACK | Freq: Once | INTRAVENOUS | Status: AC | PRN
  Administered 2019-01-07: 26.6 via INTRAVENOUS

## 2019-01-07 NOTE — Progress Notes (Signed)
Patient states he will call RT if/when ready for CPAP. Patient stated he has to drink the 'bathroom juice" for his procedure, will be up and down throughout the night and does not know if he will wear CPAP but will call if he decides to wear it. Patient resting comfortably in recliner. No distress noted.

## 2019-01-07 NOTE — Progress Notes (Signed)
The patient had a positive bleeding scan for what looks like the transverse colon or the duodenum.  The patient's upper endoscopy and capsule endoscopy did not show any sign of bleeding in the duodenum therefore the patient will be set up for a repeat colonoscopy for tomorrow.

## 2019-01-07 NOTE — Progress Notes (Signed)
[place] at [place] NAME: Connor Brewer    MR#:  [id number]  DATE OF BIRTH:  1961-05-28  SUBJECTIVE:   Patient has not had a bowel movement since the morning of his colonoscopy.  He has not noticed any bleeding from anywhere.  He endorses some generalized fatigue.  REVIEW OF SYSTEMS:  Review of Systems  Constitutional: Negative for chills and fever.  HENT: Negative for congestion and sore throat.   Eyes: Negative for blurred vision and double vision.  Respiratory: Negative for cough and shortness of breath.   Cardiovascular: Negative for chest pain and palpitations.  Gastrointestinal: Negative for blood in stool, nausea and vomiting.  Genitourinary: Negative for dysuria, hematuria and urgency.  Musculoskeletal: Negative for back pain and neck pain.  Neurological: Negative for dizziness and headaches.  Psychiatric/Behavioral: Negative for depression. The patient is not nervous/anxious.    DRUG ALLERGIES:   Allergies  Allergen Reactions   Ivp Dye [Iodinated Diagnostic Agents] Anaphylaxis, Swelling and Other (See Comments)    THROAT, LIPS SWELL [Reaction took place in 1983.]   Metrizamide Anaphylaxis, Swelling and Other (See Comments)    THROAT, LIPS SWELL [Reaction took place in 1983.] This is a component of ivp dye   VITALS:  Blood pressure (!) 136/56, pulse 79, temperature 97.6 F (36.4 C), temperature source Oral, resp. rate 16, height 5\' 11"  (1.803 m), weight 136.1 kg, SpO2 100 %. PHYSICAL EXAMINATION:  Physical Exam  GENERAL:  Laying in the bed with no acute distress.  HEENT: Head atraumatic, normocephalic. Pupils equal, round, reactive to light and accommodation. No scleral icterus. Extraocular muscles intact. Oropharynx and nasopharynx clear. + Conjunctival pallor NECK:  Supple, no jugular venous distention. No thyroid enlargement. LUNGS: Lungs are clear to auscultation bilaterally. No wheezes, crackles, rhonchi. No use of  accessory muscles of respiration.  CARDIOVASCULAR: RRR, S1, S2 normal. No murmurs, rubs, or gallops.  ABDOMEN: Soft, nontender, nondistended. Bowel sounds present.  EXTREMITIES: No pedal edema, cyanosis, or clubbing.  NEUROLOGIC: CN 2-12 intact, no focal deficits. 5/5 muscle strength throughout all extremities. Sensation intact throughout. Gait not checked.  PSYCHIATRIC: The patient is alert and oriented x 3.  SKIN: No obvious rash, lesion, or ulcer.  LABORATORY PANEL:  Male CBC Recent Labs  Lab 01/07/19 0427  WBC 6.4  HGB 7.5*  HCT 23.6*  PLT 176   ------------------------------------------------------------------------------------------------------------------ Chemistries  Recent Labs  Lab 01/02/19 1922  01/04/19 0423  NA 138   < > 140  K 3.7   < > 3.7  CL 109   < > 111  CO2 20*   < > 24  GLUCOSE 146*   < > 149*  BUN 31*   < > 24*  CREATININE 0.93   < > 1.17  CALCIUM 7.7*   < > 8.2*  AST 31  --   --   ALT 29  --   --   ALKPHOS 63  --   --   BILITOT 0.6  --   --    < > = values in this interval not displayed.   RADIOLOGY:  Nm Gi Blood Loss  Result Date: 01/07/2019 CLINICAL DATA:  Blood in stool EXAM: NUCLEAR MEDICINE GASTROINTESTINAL BLEEDING SCAN TECHNIQUE: Sequential abdominal images were obtained following intravenous administration of Tc-28m labeled red blood cells. RADIOPHARMACEUTICALS:  26.6 mCi Tc-38m pertechnetate in-vitro labeled red cells. COMPARISON:  None. FINDINGS: There is accumulating, peristalsing radiotracer activity in the central upper abdomen, which appears to  be within the duodenum or alternately the transverse colon given configuration. There is radiotracer accumulation in the urinary bladder and almost immediately between the thighs. Although this is likely related to urinary excretion, brisk rectal or anal bleeding could have this appearance. IMPRESSION: 1. There is accumulating, peristalsing radiotracer activity in the central upper abdomen, which  appears to be within the duodenum or alternately the transverse colon given configuration. 2. There is radiotracer accumulation in the urinary bladder and almost immediately between the thighs. Although this is likely related to urinary excretion, brisk rectal or anal bleeding could have this appearance. Electronically Signed   By: Eddie Candle M.D.   On: 01/07/2019 13:08   ASSESSMENT AND PLAN:   GI bleed- likely diverticular bleed. -EGD 7/6 was unremarkable -Colonoscopy 7/7 with old blood and some polyps which were removed.  No active bleeding. -Capsule study results pending -Tagged RBC scan today with active bleeding in the central upper abdomen, either in the duodenum or the transverse colon -Plan for repeat colonoscopy tomorrow morning -Continue p.o. PPI  Acute blood loss anemia-due to above.  Hemoglobin once stable this morning. -S/p 4 units PRBC -Serial H/H  GERD-stable -Continue PPI  Hypertension -Continue Norvasc -Holding losartan-HCTZ -Hydralazine IV as needed  OSA-stable -CPAP nightly  Hyperlipidemia-stable -Continue home statin  All the records are reviewed and case discussed with Care Management/Social Worker. Management plans discussed with the patient, family and they are in agreement.  CODE STATUS: Full Code  TOTAL TIME TAKING CARE OF THIS PATIENT: 40 minutes.   More than 50% of the time was spent in counseling/coordination of care: YES  POSSIBLE D/C IN 1-2 DAYS, DEPENDING ON CLINICAL CONDITION.   Berna Spare Ferlando Lia M.D on 01/07/2019 at 2:39 PM  Between 7am to 6pm - Pager [phone]  After 6pm go to [email or web address] - Proofreader  [facility]  Office  [phone]  CC: Primary care physician; Juluis Pitch, MD  Note: This dictation was prepared with Dragon dictation along with smaller phrase technology. Any transcriptional errors that result from this process are unintentional.

## 2019-01-07 NOTE — Progress Notes (Signed)
Per MD okay for RN to modified hydralazine order to 10 mg iv prn.

## 2019-01-08 ENCOUNTER — Encounter: Payer: Self-pay | Admitting: Anesthesiology

## 2019-01-08 ENCOUNTER — Encounter
Admission: EM | Disposition: A | Discharge status: Home / Self Care | Payer: Self-pay | Source: Home / Self Care | Attending: Internal Medicine

## 2019-01-08 ENCOUNTER — Inpatient Hospital Stay: Payer: Managed Care, Other (non HMO) | Admitting: Anesthesiology

## 2019-01-08 DIAGNOSIS — K633 Ulcer of intestine: Secondary | ICD-10-CM

## 2019-01-08 DIAGNOSIS — K922 Gastrointestinal hemorrhage, unspecified: Secondary | ICD-10-CM

## 2019-01-08 HISTORY — PX: ENTEROSCOPY: SHX5533

## 2019-01-08 HISTORY — PX: COLONOSCOPY WITH PROPOFOL: SHX5780

## 2019-01-08 LAB — CBC
HCT: 23.4 % — ABNORMAL LOW (ref 39.0–52.0)
Hemoglobin: 7.5 g/dL — ABNORMAL LOW (ref 13.0–17.0)
MCH: 29.5 pg (ref 26.0–34.0)
MCHC: 32.1 g/dL (ref 30.0–36.0)
MCV: 92.1 fL (ref 80.0–100.0)
Platelets: 196 10*3/uL (ref 150–400)
RBC: 2.54 MIL/uL — ABNORMAL LOW (ref 4.22–5.81)
RDW: 14.9 % (ref 11.5–15.5)
WBC: 6.5 10*3/uL (ref 4.0–10.5)
nRBC: 0 % (ref 0.0–0.2)

## 2019-01-08 LAB — HEMOGLOBIN AND HEMATOCRIT, BLOOD
HCT: 25.3 % — ABNORMAL LOW (ref 39.0–52.0)
HCT: 25.7 % — ABNORMAL LOW (ref 39.0–52.0)
Hemoglobin: 7.9 g/dL — ABNORMAL LOW (ref 13.0–17.0)
Hemoglobin: 8.3 g/dL — ABNORMAL LOW (ref 13.0–17.0)

## 2019-01-08 SURGERY — COLONOSCOPY WITH PROPOFOL
Anesthesia: General

## 2019-01-08 MED ORDER — PROPOFOL 10 MG/ML IV BOLUS
INTRAVENOUS | Status: DC | PRN
  Administered 2019-01-08 (×3): 50 mg via INTRAVENOUS

## 2019-01-08 MED ORDER — PROPOFOL 500 MG/50ML IV EMUL
INTRAVENOUS | Status: DC | PRN
  Administered 2019-01-08: 100 ug/kg/min via INTRAVENOUS

## 2019-01-08 MED ORDER — SODIUM CHLORIDE 0.9% IV SOLUTION: Freq: Once | INTRAVENOUS | Status: DC

## 2019-01-08 NOTE — Anesthesia Postprocedure Evaluation (Signed)
Anesthesia Post Note  Patient: Connor Brewer  Procedure(s) Performed: COLONOSCOPY WITH PROPOFOL (N/A ) ENTEROSCOPY  Patient location during evaluation: PACU Anesthesia Type: General Level of consciousness: awake and alert Pain management: pain level controlled Vital Signs Assessment: post-procedure vital signs reviewed and stable Respiratory status: spontaneous breathing, nonlabored ventilation, respiratory function stable and patient connected to nasal cannula oxygen Cardiovascular status: blood pressure returned to baseline and stable Postop Assessment: no apparent nausea or vomiting Anesthetic complications: no     Last Vitals:  Vitals:   01/08/19 1250 01/08/19 1300  BP: 140/87 (!) 150/76  Pulse: (!) 58 (!) 59  Resp: 15 13  Temp:    SpO2: 100% 100%    Last Pain:  Vitals:   01/08/19 1300  TempSrc:   PainSc: 0-No pain                 Martha Clan

## 2019-01-08 NOTE — H&P (Addendum)
[name], MD [address], [place], [place], [place], [address] [address], [place], [place], [place], [address] Phone: [phone]  Fax: [phone]  Primary Care Physician:  Juluis Pitch, MD   Pre-Procedure History & Physical: HPI:  Connor Brewer is a 58 y.o. male is here for an colonoscopy and possible push enteroscopy.   Past Medical History:  Diagnosis Date  . Bronchitis    " aalergy driven"  . Diabetes mellitus without complication ([place])   . DVT (deep venous thrombosis) (HCC)    LLE  . GERD (gastroesophageal reflux disease)   . H/O seasonal allergies   . Hypertension   . Obesity   . Osteoarthritis    right hip  . PONV (postoperative nausea and vomiting)   . Pre-diabetes   . Sleep apnea    wears CPAP    Past Surgical History:  Procedure Laterality Date  . BACK SURGERY  2014   laser spine  . COLONOSCOPY W/ BIOPSIES AND POLYPECTOMY    . COLONOSCOPY WITH PROPOFOL N/A 01/04/2019   Procedure: COLONOSCOPY WITH PROPOFOL;  Surgeon: Lucilla Lame, MD;  Location: [facility] ENDOSCOPY;  Service: Endoscopy;  Laterality: N/A;  . ESOPHAGOGASTRODUODENOSCOPY (EGD) WITH PROPOFOL N/A 01/03/2019   Procedure: ESOPHAGOGASTRODUODENOSCOPY (EGD) WITH PROPOFOL;  Surgeon: Lucilla Lame, MD;  Location: [facility] ENDOSCOPY;  Service: Endoscopy;  Laterality: N/A;  . FRACTURE SURGERY  [phone]   right arm, left knee, right knee (rod in right arm, screws in right knee, one screw in left knee)  . GIVENS CAPSULE STUDY N/A 01/06/2019   Procedure: GIVENS CAPSULE STUDY;  Surgeon: [name], MD;  Location: [facility] ENDOSCOPY;  Service: Gastroenterology;  Laterality: N/A;  . KNEE ARTHROPLASTY Left 02/19/2018   Procedure: COMPUTER ASSISTED TOTAL KNEE ARTHROPLASTY;  Surgeon: Dereck Leep, MD;  Location: [facility] ORS;  Service: Orthopedics;  Laterality: Left;  Marland Kitchen MULTIPLE TOOTH EXTRACTIONS    . TOTAL HIP ARTHROPLASTY Right 06/09/2016   Procedure: TOTAL HIP ARTHROPLASTY ANTERIOR APPROACH;  Surgeon:  Frederik Pear, MD;  Location: [facility];  Service: Orthopedics;  Laterality: Right;    Prior to Admission medications   Medication Sig Start Date End Date Taking? Authorizing Provider  amLODipine (NORVASC) 10 MG tablet Take 10 mg by mouth daily.    Yes [provider]  atorvastatin (LIPITOR) 20 MG tablet Take 20 mg by mouth daily.   Yes [provider]  bifidobacterium infantis (ALIGN) capsule Take 1 capsule by mouth daily.   Yes [provider]  fexofenadine (ALLEGRA) 180 MG tablet Take 180 mg by mouth daily.    Yes [provider]  losartan-hydrochlorothiazide (HYZAAR) 50-12.5 MG tablet Take 1 tablet by mouth daily.    Yes [provider]  metFORMIN (GLUCOPHAGE-XR) 750 MG 24 hr tablet Take 750 mg by mouth daily. 01/17/17  Yes [provider]  Multiple Vitamins-Minerals (CENTRUM SILVER 50+MEN PO) Take 1 tablet by mouth daily.   Yes [provider]  Omega-3 Fatty Acids (FISH OIL) 500 MG CAPS Take 500 mg by mouth daily.   Yes [provider]  omeprazole (PRILOSEC) 20 MG capsule Take 20 mg by mouth daily.   Yes [provider]  albuterol (VENTOLIN HFA) 108 (90 Base) MCG/ACT inhaler INHALE 2 INHALATIONS INTO THE LUNGS EVERY 6 HOURS AS NEEDED FOR SEASONAL 05/05/16   [provider]  enoxaparin (LOVENOX) 40 MG/0.4ML injection Inject 0.4 mLs (40 mg total) into the skin daily. Patient not taking: Reported on 01/02/2019 02/21/18   [name],  PA-C  naproxen sodium (ALEVE) 220 MG tablet Take 440 mg by mouth daily as needed (for pain or headache).    [provider]  oxyCODONE (OXY IR/ROXICODONE) 5 MG immediate release tablet Take 1 tablet (5 mg total) by mouth every 4 (four) hours as needed for moderate pain (pain score 4-6). Patient not taking: Reported on 01/02/2019 02/21/18   [name], PA-C  traMADol (ULTRAM) 50 MG tablet Take 1-2 tablets (50-100 mg total) by mouth every 4 (four) hours as needed for moderate pain.  Patient not taking: Reported on 01/02/2019 02/21/18   [name], PA-C    Allergies as of 01/02/2019 - Review Complete 01/02/2019  Allergen Reaction Noted  . Ivp dye [iodinated diagnostic agents] Anaphylaxis, Swelling, and Other (See Comments) 05/26/2016  . Metrizamide Anaphylaxis, Swelling, and Other (See Comments) 05/26/2016    Family History  Problem Relation Age of Onset  . Lung cancer Father   . Cancer Brother     Social History   Socioeconomic History  . Marital status: Married    Spouse name: [name]  . Number of children: 2  . Years of education: Not on file  . Highest education level: Not on file  Occupational History  . Not on file  Social Needs  . Financial resource strain: Not very hard  . Food insecurity    Worry: Never true    Inability: Not on file  . Transportation needs    Medical: No    Non-medical: No  Tobacco Use  . Smoking status: Current Some Day Smoker    Types: Cigars  . Smokeless tobacco: Never Used  Substance and Sexual Activity  . Alcohol use: Yes    Comment: social  . Drug use: No  . Sexual activity: Not on file  Lifestyle  . Physical activity    Days per week: 0 days    Minutes per session: 0 min  . Stress: Not at all  Relationships  . Social Herbalist on phone: Patient refused    Gets together: Patient refused    Attends religious service: Patient refused    Active member of club or organization: Patient refused    Attends meetings of clubs or organizations: Patient refused    Relationship status: Patient refused  . Intimate partner violence    Fear of current or ex partner: No    Emotionally abused: No    Physically abused: No    Forced sexual activity: No  Other Topics Concern  . Not on file  Social History Narrative  . Not on file    Review of Systems: See HPI, otherwise negative ROS  Physical Exam: BP (!) 145/66 (BP Location: Right Arm)   Pulse 72   Temp (!) 97.5 F (36.4 C) (Oral)   Resp 18   Ht 5'  11" (1.803 m)   Wt 136.1 kg   SpO2 99%   BMI 41.84 kg/m  General:   Alert,  pleasant and cooperative in NAD Head:  Normocephalic and atraumatic. Neck:  Supple; no masses or thyromegaly. Lungs:  Clear throughout to auscultation, normal respiratory effort.    Heart:  +S1, +S2, Regular rate and rhythm, No edema. Abdomen:  Soft, nontender and nondistended. Normal bowel sounds, without guarding, and without rebound.   Neurologic:  Alert and  oriented x4;  grossly normal neurologically.  Impression/Plan: Dillard Essex is here for an colonoscopy to be performed for GI bleed seen on tagged RBC scan in the colon /  Risks, benefits, limitations, and alternatives regarding  colonoscopy and if negative will perform a push enteroscopy have been reviewed with the patient.  Questions have been answered.  All parties agreeable.   [name], MD  01/08/2019, 11:50 AM

## 2019-01-08 NOTE — Anesthesia Preprocedure Evaluation (Signed)
Anesthesia Evaluation  Patient identified by MRN, date of birth, ID band Patient awake    Reviewed: Allergy & Precautions, NPO status , Patient's Chart, lab work & pertinent test results  History of Anesthesia Complications (+) PONV and history of anesthetic complications  Airway Mallampati: III       Dental   Pulmonary sleep apnea and Continuous Positive Airway Pressure Ventilation , COPD,  COPD inhaler, Current Smoker,    Pulmonary exam normal        Cardiovascular hypertension, Pt. on medications (-) Past MI and (-) CHF Normal cardiovascular exam(-) dysrhythmias (-) Valvular Problems/Murmurs     Neuro/Psych neg Seizures    GI/Hepatic Neg liver ROS, GERD  Medicated and Controlled,  Endo/Other  diabetes, Type 2, Oral Hypoglycemic AgentsMorbid obesity  Renal/GU negative Renal ROS  negative genitourinary   Musculoskeletal  (+) Arthritis , Osteoarthritis,    Abdominal Normal abdominal exam  (+)   Peds negative pediatric ROS (+)  Hematology   Anesthesia Other Findings   Reproductive/Obstetrics                             Anesthesia Physical  Anesthesia Plan  ASA: III  Anesthesia Plan: General   Post-op Pain Management:    Induction: Intravenous  PONV Risk Score and Plan: Propofol infusion  Airway Management Planned: Nasal Cannula  Additional Equipment:   Intra-op Plan:   Post-operative Plan:   Informed Consent: I have reviewed the patients History and Physical, chart, labs and discussed the procedure including the risks, benefits and alternatives for the proposed anesthesia with the patient or authorized representative who has indicated his/her understanding and acceptance.       Plan Discussed with: CRNA and Surgeon  Anesthesia Plan Comments:         Anesthesia Quick Evaluation

## 2019-01-08 NOTE — Op Note (Signed)
[facility] Gastroenterology Patient Name: Connor Brewer Procedure Date: 01/08/2019 11:32 AM MRN: [id number] Account #: [id number] Date of Birth: January 25, 1961 Admit Type: Inpatient Age: 58 Room: [facility] ENDO ROOM 4 Gender: Male Note Status: Finalized Procedure:            Colonoscopy Indications:          Evaluation of abnormal imaging study (likely to be                        clinically significant), positive nuclear bleeding scan                        with bleed in possible transverse colon Providers:            [name] MD, MD Referring MD:         Youlanda Roys. Lovie Macadamia, MD (Referring MD) Complications:        No immediate complications. Procedure:            Pre-Anesthesia Assessment:                       - Prior to the procedure, a History and Physical was                        performed, and patient medications, allergies and                        sensitivities were reviewed. The patient's tolerance of                        previous anesthesia was reviewed.                       - The risks and benefits of the procedure and the                        sedation options and risks were discussed with the                        patient. All questions were answered and informed                        consent was obtained.                       - ASA Grade Assessment: II - A patient with mild                        systemic disease.                       After obtaining informed consent, the colonoscope was                        passed under direct vision. Throughout the procedure,                        the patient's blood pressure, pulse, and oxygen                        saturations were monitored continuously. The  Colonoscope was introduced through the anus and                        advanced to the the cecum, identified by the                        appendiceal orifice, IC valve and transillumination.                        The  colonoscopy was performed with ease. The patient                        tolerated the procedure well. The quality of the bowel                        preparation was adequate. Findings:      The perianal and digital rectal examinations were normal.      Discontinuous areas of nonbleeding ulcerated mucosa with no stigmata of       recent bleeding were present in the distal transverse colon. 3 clean       based ulcers seen , 5-8 mm - ? nsaid induced      The exam was otherwise without abnormality on direct and retroflexion       views. Impression:           - Mucosal ulceration.                       - The examination was otherwise normal on direct and                        retroflexion views.                       - No specimens collected. Recommendation:       - Return patient to hospital ward for ongoing care.                       - Clear liquid diet today.                       - 1. stop NSAID's                       2. If recurs then repeat small bowel capsule study                       3. no blood seen anywhere in the colon or black colored                        stool, yelklow stool seen suggesting no active bleed                       4. In outpatient need to discuss with Dr Gustavo Lah                        ?repeat colonoscopy to check for healing of the ulcer-                        likely NSAId related other causes include crohns as  well as occasionally malignancy Procedure Code(s):    --- Professional ---                       [phone], Colonoscopy, flexible; diagnostic, including                        collection of specimen(s) by brushing or washing, when                        performed (separate procedure) Diagnosis Code(s):    --- Professional ---                       K63.3, Ulcer of intestine                       R93.3, Abnormal findings on diagnostic imaging of other                        parts of digestive tract CPT copyright 2019 American  Medical Association. All rights reserved. The codes documented in this report are preliminary and upon coder review may  be revised to meet current compliance requirements. [name], MD [name] MD, MD 01/08/2019 12:22:27 PM This report has been signed electronically. Number of Addenda: 0 Note Initiated On: 01/08/2019 11:32 AM Scope Withdrawal Time: 0 hours 8 minutes 29 seconds  Total Procedure Duration: 0 hours 13 minutes 46 seconds  Estimated Blood Loss: Estimated blood loss: none.      [facility]

## 2019-01-08 NOTE — Transfer of Care (Signed)
Immediate Anesthesia Transfer of Care Note  Patient: ZYAIR RUSSI  Procedure(s) Performed: COLONOSCOPY WITH PROPOFOL (N/A ) ENTEROSCOPY  Patient Location: PACU  Anesthesia Type:General  Level of Consciousness: awake and alert   Airway & Oxygen Therapy: Patient Spontanous Breathing  Post-op Assessment: Report given to RN  Post vital signs: Reviewed and stable  Last Vitals:  Vitals Value Taken Time  BP 109/58 01/08/19 1221  Temp 37 C 01/08/19 1221  Pulse 60 01/08/19 1221  Resp 19 01/08/19 1221  SpO2 100 % 01/08/19 1221    Last Pain:  Vitals:   01/08/19 1220  TempSrc:   PainSc: 0-No pain         Complications: No apparent anesthesia complications

## 2019-01-08 NOTE — Anesthesia Post-op Follow-up Note (Signed)
Anesthesia QCDR form completed.        

## 2019-01-08 NOTE — Progress Notes (Signed)
[place] [facility] [place] NAME: Kyrel Leighton    MR#:  [id number]  DATE OF BIRTH:  [birth year]  SUBJECTIVE:   Patient denies any episodes of hematemesis or melena or bowel movements with blood.  Globin trended down from 9-7.5 and repeat colonoscopy today  REVIEW OF SYSTEMS:  Review of Systems  Constitutional: Negative for chills and fever.  HENT: Negative for congestion and sore throat.   Eyes: Negative for blurred vision and double vision.  Respiratory: Negative for cough and shortness of breath.   Cardiovascular: Negative for chest pain and palpitations.  Gastrointestinal: Negative for blood in stool, nausea and vomiting.  Genitourinary: Negative for dysuria, hematuria and urgency.  Musculoskeletal: Negative for back pain and neck pain.  Neurological: Negative for dizziness and headaches.  Psychiatric/Behavioral: Negative for depression. The patient is not nervous/anxious.    DRUG ALLERGIES:   Allergies  Allergen Reactions  . Ivp Dye [Iodinated Diagnostic Agents] Anaphylaxis, Swelling and Other (See Comments)    THROAT, LIPS SWELL [Reaction took place in 1983.]  . Metrizamide Anaphylaxis, Swelling and Other (See Comments)    THROAT, LIPS SWELL [Reaction took place in 1983.] This is a component of ivp dye   VITALS:  Blood pressure (!) 150/76, pulse (!) 59, temperature 98.6 F (37 C), resp. rate 13, height 5\' 11"  (1.803 m), weight 136.1 kg, SpO2 100 %. PHYSICAL EXAMINATION:  Physical Exam  GENERAL:  Laying in the bed with no acute distress.  HEENT: Head atraumatic, normocephalic. Pupils equal, round, reactive to light and accommodation. No scleral icterus. Extraocular muscles intact. Oropharynx and nasopharynx clear. + Conjunctival pallor NECK:  Supple, no jugular venous distention. No thyroid enlargement. LUNGS: Lungs are clear to auscultation bilaterally. No wheezes, crackles, rhonchi. No use of accessory muscles of respiration.   CARDIOVASCULAR: RRR, S1, S2 normal. No murmurs, rubs, or gallops.  ABDOMEN: Soft, nontender, nondistended. Bowel sounds present.  EXTREMITIES: No pedal edema, cyanosis, or clubbing.  NEUROLOGIC: CN 2-12 intact, no focal deficits. 5/5 muscle strength throughout all extremities. Sensation intact throughout. Gait not checked.  PSYCHIATRIC: The patient is alert and oriented x 3.  SKIN: No obvious rash, lesion, or ulcer.  LABORATORY PANEL:  Male CBC Recent Labs  Lab 01/08/19 0508  WBC 6.5  HGB 7.5*  HCT 23.4*  PLT 196   ------------------------------------------------------------------------------------------------------------------ Chemistries  Recent Labs  Lab 01/02/19 1922  01/04/19 0423  NA 138   < > 140  K 3.7   < > 3.7  CL 109   < > 111  CO2 20*   < > 24  GLUCOSE 146*   < > 149*  BUN 31*   < > 24*  CREATININE 0.93   < > 1.17  CALCIUM 7.7*   < > 8.2*  AST 31  --   --   ALT 29  --   --   ALKPHOS 63  --   --   BILITOT 0.6  --   --    < > = values in this interval not displayed.   RADIOLOGY:  No results found. ASSESSMENT AND PLAN:   GI bleed- likely diverticular bleed. -EGD 7/6 was unremarkable -Colonoscopy 7/7 with old blood and some polyps which were removed.  No active bleeding. -Capsule study no acute findings -Tagged RBC scan-01/07/2019 with active bleeding in the central upper abdomen, either in the duodenum or the transverse colon -Repeat colonoscopy 01/08/2019 has revealed nonbleeding ulcers.  Continue to follow-up with gastroenterology -Continue  p.o. PPI -Monitor hemoglobin hematocrit and transfuse as needed  Acute blood loss anemia-due to above.  Hemoglobin once stable this morning. -S/p 4 units PRBC -Serial H/H  GERD-stable -Continue PPI  Hypertension -Continue Norvasc -Holding losartan-HCTZ -Hydralazine IV as needed  OSA-stable -CPAP nightly  Hyperlipidemia-stable -Continue home statin  All the records are reviewed and case discussed  with Care Management/Social Worker. Management plans discussed with the patient, family and they are in agreement.  CODE STATUS: Full Code  TOTAL TIME TAKING CARE OF THIS PATIENT: 36  minutes.   More than 50% of the time was spent in counseling/coordination of care: YES  POSSIBLE D/C IN 1-2 DAYS, DEPENDING ON CLINICAL CONDITION.   Nicholes Mango M.D on 01/08/2019 at 2:29 PM  Between 7am to 6pm - Pager - [phone]  After 6pm go to [email or web address] - Proofreader  [facility]  Office  [phone]  CC: Primary care physician; Juluis Pitch, MD  Note: This dictation was prepared with Dragon dictation along with smaller phrase technology. Any transcriptional errors that result from this process are unintentional.

## 2019-01-09 LAB — CBC
HCT: 23.7 % — ABNORMAL LOW (ref 39.0–52.0)
Hemoglobin: 7.5 g/dL — ABNORMAL LOW (ref 13.0–17.0)
MCH: 28.8 pg (ref 26.0–34.0)
MCHC: 31.6 g/dL (ref 30.0–36.0)
MCV: 91.2 fL (ref 80.0–100.0)
Platelets: 186 10*3/uL (ref 150–400)
RBC: 2.6 MIL/uL — ABNORMAL LOW (ref 4.22–5.81)
RDW: 14.6 % (ref 11.5–15.5)
WBC: 6.2 10*3/uL (ref 4.0–10.5)
nRBC: 0.3 % — ABNORMAL HIGH (ref 0.0–0.2)

## 2019-01-09 LAB — TYPE AND SCREEN
ABO/RH(D): B POS
Antibody Screen: NEGATIVE
Unit division: 0
Unit division: 0

## 2019-01-09 LAB — BPAM RBC
Blood Product Expiration Date: 202008022359
Blood Product Expiration Date: 202008102359
ISSUE DATE / TIME: 202007091235
Unit Type and Rh: 7300
Unit Type and Rh: 7300

## 2019-01-09 LAB — HEMOGLOBIN AND HEMATOCRIT, BLOOD
HCT: 25.5 % — ABNORMAL LOW (ref 39.0–52.0)
Hemoglobin: 8.1 g/dL — ABNORMAL LOW (ref 13.0–17.0)

## 2019-01-09 LAB — PREPARE RBC (CROSSMATCH)

## 2019-01-09 MED ORDER — DOCUSATE SODIUM 100 MG PO CAPS: 100.0000 mg | ORAL_CAPSULE | Freq: Every day | ORAL | 0 refills | Status: DC

## 2019-01-09 MED ORDER — FERROUS SULFATE 325 (65 FE) MG PO TBEC: 325.0000 mg | DELAYED_RELEASE_TABLET | Freq: Two times a day (BID) | ORAL | 3 refills | Status: AC

## 2019-01-09 MED ORDER — ASPIRIN EC 81 MG PO TBEC: 81.0000 mg | DELAYED_RELEASE_TABLET | Freq: Every day | ORAL | 0 refills | Status: AC

## 2019-01-09 MED ORDER — PANTOPRAZOLE SODIUM 40 MG PO TBEC: 40.0000 mg | DELAYED_RELEASE_TABLET | Freq: Two times a day (BID) | ORAL | 0 refills | Status: AC

## 2019-01-09 NOTE — Discharge Instructions (Signed)
Follow-up with primary care physician in 3 days Follow-up with gastroenterology office in 2 days to get CBC done and follow-up with gastroenterology in a week

## 2019-01-09 NOTE — Progress Notes (Signed)
MD ordered patient to be discharged home.  Discharge instructions were reviewed with the patient and he voiced understanding. Patient instructed on making follow-up appointment.  Prescriptions sent to the patients pharmacy.  IV was removed with catheter intact.  All patients questions were answered.  Patient left via wheelchair escorted by NT

## 2019-01-09 NOTE — Progress Notes (Signed)
[name] , MD [address] [place], [place], [place], [address] [address], [place], [place], [place], [address] Phone: [phone]  Fax: [phone]   Connor Brewer is being followed for GI bleed    Subjective: No bowel movement feels well.    Objective: Vital signs in last 24 hours: Vitals:   01/08/19 1250 01/08/19 1300 01/08/19 2122 01/09/19 0536  BP: 140/87 (!) 150/76 (!) 170/76 (!) 145/73  Pulse: (!) 58 (!) 59 71 64  Resp: 15 13 20 18   Temp:   98.2 F (36.8 C) 97.9 F (36.6 C)  TempSrc:   Oral Oral  SpO2: 100% 100% 99% 98%  Weight:      Height:       Weight change:  No intake or output data in the 24 hours ending 01/09/19 0949   Exam: Heart:: Regular rate and rhythm, S1S2 present or without murmur or extra heart sounds Lungs: normal, clear to auscultation and clear to auscultation and percussion Abdomen: soft, nontender, normal bowel sounds   Lab Results: @LABTEST2 @ Micro Results: Recent Results (from the past 240 hour(s))  SARS Coronavirus 2 (CEPHEID - Performed in [facility] hospital lab), Hosp Order     Status: None   Collection Time: 01/02/19  9:03 PM   Specimen: Nasopharyngeal Swab  Result Value Ref Range Status   SARS Coronavirus 2 NEGATIVE NEGATIVE Final    Comment: (NOTE) If result is NEGATIVE SARS-CoV-2 target nucleic acids are NOT DETECTED. The SARS-CoV-2 RNA is generally detectable in upper and lower  respiratory specimens during the acute phase of infection. The lowest  concentration of SARS-CoV-2 viral copies this assay can detect is 250  copies / mL. A negative result does not preclude SARS-CoV-2 infection  and should not be used as the sole basis for treatment or other  patient management decisions.  A negative result may occur with  improper specimen collection / handling, submission of specimen other  than nasopharyngeal swab, presence of viral mutation(s) within the  areas targeted by this assay, and inadequate number of  viral copies  (<250 copies / mL). A negative result must be combined with clinical  observations, patient history, and epidemiological information. If result is POSITIVE SARS-CoV-2 target nucleic acids are DETECTED. The SARS-CoV-2 RNA is generally detectable in upper and lower  respiratory specimens dur ing the acute phase of infection.  Positive  results are indicative of active infection with SARS-CoV-2.  Clinical  correlation with patient history and other diagnostic information is  necessary to determine patient infection status.  Positive results do  not rule out bacterial infection or co-infection with other viruses. If result is PRESUMPTIVE POSTIVE SARS-CoV-2 nucleic acids MAY BE PRESENT.   A presumptive positive result was obtained on the submitted specimen  and confirmed on repeat testing.  While 2019 novel coronavirus  (SARS-CoV-2) nucleic acids may be present in the submitted sample  additional confirmatory testing may be necessary for epidemiological  and / or clinical management purposes  to differentiate between  SARS-CoV-2 and other Sarbecovirus currently known to infect humans.  If clinically indicated additional testing with an alternate test  methodology [phone]) is advised. The SARS-CoV-2 RNA is generally  detectable in upper and lower respiratory sp ecimens during the acute  phase of infection. The expected result is Negative. Fact Sheet for Patients:  StrictlyIdeas.no Fact Sheet for Healthcare Providers: BankingDealers.co.za This test is not yet approved or cleared by the [place] FDA and has been authorized for detection  and/or diagnosis of SARS-CoV-2 by FDA under an Emergency Use Authorization (EUA).  This EUA will remain in effect (meaning this test can be used) for the duration of the COVID-19 declaration under Section 564(b)(1) of the Act, 21 U.S.C. section 360bbb-3(b)(1), unless the authorization is  terminated or revoked sooner. Performed at [facility], [address] [place], [place] [address]    Studies/Results: Nm Gi Blood Loss  Result Date: 01/07/2019 CLINICAL DATA:  Blood in stool EXAM: NUCLEAR MEDICINE GASTROINTESTINAL BLEEDING SCAN TECHNIQUE: Sequential abdominal images were obtained following intravenous administration of Tc-57m labeled red blood cells. RADIOPHARMACEUTICALS:  26.6 mCi Tc-25m pertechnetate in-vitro labeled red cells. COMPARISON:  None. FINDINGS: There is accumulating, peristalsing radiotracer activity in the central upper abdomen, which appears to be within the duodenum or alternately the transverse colon given configuration. There is radiotracer accumulation in the urinary bladder and almost immediately between the thighs. Although this is likely related to urinary excretion, brisk rectal or anal bleeding could have this appearance. IMPRESSION: 1. There is accumulating, peristalsing radiotracer activity in the central upper abdomen, which appears to be within the duodenum or alternately the transverse colon given configuration. 2. There is radiotracer accumulation in the urinary bladder and almost immediately between the thighs. Although this is likely related to urinary excretion, brisk rectal or anal bleeding could have this appearance. Electronically Signed   By: Eddie Candle M.D.   On: 01/07/2019 13:08   Medications: I have reviewed the patient's current medications. Scheduled Meds: . sodium chloride   Intravenous Once  . sodium chloride   Intravenous Once  . amLODipine  10 mg Oral Daily  . atorvastatin  20 mg Oral Daily  . loratadine  10 mg Oral Daily  . pantoprazole  40 mg Oral BID  . sodium chloride flush  3 mL Intravenous Once   Continuous Infusions: PRN Meds:.acetaminophen **OR** acetaminophen, hydrALAZINE, ondansetron **OR** ondansetron (ZOFRAN) IV   Assessment: Active Problems:   GI bleed   Acute posthemorrhagic anemia   Other  diseases of stomach and duodenum   Blood in stool   Polyp of sigmoid colon   Connor Brewer 58 y.o. male admitted with melena , EGD+colonoscopy x2 normal. Last colonoscopy performed 01/08/2019 after tagged RBC scan showed possible bleed in the Transverse colon. Capsule study didn't show a bleed in the dodenum/proximal jejunum, visualization after was impaired due to liquid stool and some dark material which may have been old or new blood.   Plan: 1. Hb stable over 3 days - no melena- check later today if stable home with close outpatient follow up- check CBC in 2-3 days and follow up with Dr Allen Norris in GI clinic     LOS: 7 days   [name], MD 01/09/2019, 9:49 AM

## 2019-01-09 NOTE — Discharge Summary (Addendum)
[facility] NAME: Connor Brewer    MR#:  [id number]  DATE OF BIRTH:  [birth year]  DATE OF ADMISSION:  01/02/2019 ADMITTING PHYSICIAN: Christel Mormon, MD  DATE OF DISCHARGE:  01/09/19  PRIMARY CARE PHYSICIAN: Juluis Pitch, MD    ADMISSION DIAGNOSIS:  Weakness [R53.1] Gastrointestinal hemorrhage, unspecified gastrointestinal hemorrhage type [K92.2]  DISCHARGE DIAGNOSIS:   GI bleed SECONDARY DIAGNOSIS:   Past Medical History:  Diagnosis Date  . Bronchitis    " aalergy driven"  . Diabetes mellitus without complication ([facility])   . DVT (deep venous thrombosis) ([facility])    LLE  . GERD (gastroesophageal reflux disease)   . H/O seasonal allergies   . Hypertension   . Obesity   . Osteoarthritis    right hip  . PONV (postoperative nausea and vomiting)   . Pre-diabetes   . Sleep apnea    wears CPAP    HOSPITAL COURSE:   GI bleed- likely diverticular bleed. -EGD 7/6 was unremarkable -Colonoscopy 7/7 with old blood and some polyps which were removed.  No active bleeding. -Capsule study no acute findings -Tagged RBC scan-01/07/2019 with active bleeding in the central upper abdomen, either in the duodenum or the transverse colon -Repeat colonoscopy 01/08/2019 has revealed discontinuous areas of nonbleeding ulcerated mucosa with no stigmata of recent bleeding were present in the distal transverse colon. 3 clean based ulcers seen , 5-8 mm - ? nsaid induced.  Stop using NSAIDs okay to resume enteric-coated baby aspirin from tomorrow -Continue p.o. PPI -Repeat hemoglobin 7.5-8.1 no other episodes of bleeding okay to discharge patient from GI standpoint and repeat CBC in 2 days at gastroenterology office and follow-up with Dr. Allen Norris in a week   Acute blood loss anemia-due to above.  Hemoglobin once stable this morning. -S/p 4 units PRBC -Serial H/H monitored during the hospital course  GERD-stable -Continue  PPI  Hypertension -Continue Norvasc losartan-HCTZ -Hydralazine IV as needed provided during the hospital course  OSA-stable -CPAP nightly  Hyperlipidemia-stable -Continue home statin    DISCHARGE CONDITIONS:   Stable  CONSULTS OBTAINED:  Treatment Team:  Lucilla Lame, MD [name], MD   PROCEDURES EGD, colonoscopy x2, capsule study  DRUG ALLERGIES:   Allergies  Allergen Reactions  . Ivp Dye [Iodinated Diagnostic Agents] Anaphylaxis, Swelling and Other (See Comments)    THROAT, LIPS SWELL [Reaction took place in 1983.]  . Metrizamide Anaphylaxis, Swelling and Other (See Comments)    THROAT, LIPS SWELL [Reaction took place in 1983.] This is a component of ivp dye    DISCHARGE MEDICATIONS:   Allergies as of 01/09/2019      Reactions   Ivp Dye [iodinated Diagnostic Agents] Anaphylaxis, Swelling, Other (See Comments)   THROAT, LIPS SWELL [Reaction took place in 1983.]   Metrizamide Anaphylaxis, Swelling, Other (See Comments)   THROAT, LIPS SWELL [Reaction took place in 1983.] This is a component of ivp dye      Medication List    STOP taking these medications   enoxaparin 40 MG/0.4ML injection Commonly known as: LOVENOX   naproxen sodium 220 MG tablet Commonly known as: ALEVE   omeprazole 20 MG capsule Commonly known as: PRILOSEC   oxyCODONE 5 MG immediate release tablet Commonly known as: Oxy IR/ROXICODONE   traMADol 50 MG tablet Commonly known as: ULTRAM     TAKE these medications   amLODipine 10 MG tablet Commonly known as: NORVASC Take 10 mg by mouth daily.   aspirin  EC 81 MG tablet Take 1 tablet (81 mg total) by mouth daily. Start taking on: January 10, 2019   atorvastatin 20 MG tablet Commonly known as: LIPITOR Take 20 mg by mouth daily.   bifidobacterium infantis capsule Take 1 capsule by mouth daily.   CENTRUM SILVER 50+MEN PO Take 1 tablet by mouth daily.   docusate sodium 100 MG capsule Commonly known as: Colace Take 1  capsule (100 mg total) by mouth daily.   ferrous sulfate 325 (65 FE) MG EC tablet Take 1 tablet (325 mg total) by mouth 2 (two) times daily.   fexofenadine 180 MG tablet Commonly known as: ALLEGRA Take 180 mg by mouth daily.   Fish Oil 500 MG Caps Take 500 mg by mouth daily.   losartan-hydrochlorothiazide 50-12.5 MG tablet Commonly known as: HYZAAR Take 1 tablet by mouth daily.   metFORMIN 750 MG 24 hr tablet Commonly known as: GLUCOPHAGE-XR Take 750 mg by mouth daily.   pantoprazole 40 MG tablet Commonly known as: PROTONIX Take 1 tablet (40 mg total) by mouth 2 (two) times daily.   Ventolin HFA 108 (90 Base) MCG/ACT inhaler Generic drug: albuterol INHALE 2 INHALATIONS INTO THE LUNGS EVERY 6 HOURS AS NEEDED FOR SEASONAL        DISCHARGE INSTRUCTIONS:   Follow-up with primary care physician in 3 days Follow-up with gastroenterology office in 2 days to get CBC done and follow-up with gastroenterology in a week.  Stop using NSAIDs   DIET:  Cardiac diet and Diabetic diet  DISCHARGE CONDITION:  Fair  ACTIVITY:  Activity as tolerated  OXYGEN:  Home Oxygen: No.   Oxygen Delivery: room air  DISCHARGE LOCATION:  home   If you experience worsening of your admission symptoms, develop shortness of breath, life threatening emergency, suicidal or homicidal thoughts you must seek medical attention immediately by calling 911 or calling your MD immediately  if symptoms less severe.  You Must read complete instructions/literature along with all the possible adverse reactions/side effects for all the Medicines you take and that have been prescribed to you. Take any new Medicines after you have completely understood and accpet all the possible adverse reactions/side effects.   Please note  You were cared for by a hospitalist during your hospital stay. If you have any questions about your discharge medications or the care you received while you were in the hospital after you  are discharged, you can call the unit and asked to speak with the hospitalist on call if the hospitalist that took care of you is not available. Once you are discharged, your primary care physician will handle any further medical issues. Please note that NO REFILLS for any discharge medications will be authorized once you are discharged, as it is imperative that you return to your primary care physician (or establish a relationship with a primary care physician if you do not have one) for your aftercare needs so that they can reassess your need for medications and monitor your lab values.     Today  Chief Complaint  Patient presents with  . Fatigue  . Rectal Bleeding   Patient is feeling fine.  Hemoglobin is stable.  No other episodes of bleeding noticed.  Okay to discharge patient from GI standpoint.  Patient has to get repeat CBC checkup at GI office in 2 days and follow-up with gastroenterology as an outpatient approximately in a week  ROS:  CONSTITUTIONAL: Denies fevers, chills. Denies any fatigue, weakness.  EYES: Denies blurry vision, double  vision, eye pain. EARS, NOSE, THROAT: Denies tinnitus, ear pain, hearing loss. RESPIRATORY: Denies cough, wheeze, shortness of breath.  CARDIOVASCULAR: Denies chest pain, palpitations, edema.  GASTROINTESTINAL: Denies nausea, vomiting, diarrhea, abdominal pain. Denies bright red blood per rectum. GENITOURINARY: Denies dysuria, hematuria. ENDOCRINE: Denies nocturia or thyroid problems. HEMATOLOGIC AND LYMPHATIC: Denies easy bruising or bleeding. SKIN: Denies rash or lesion. MUSCULOSKELETAL: Denies pain in neck, back, shoulder, knees, hips or arthritic symptoms.  NEUROLOGIC: Denies paralysis, paresthesias.  PSYCHIATRIC: Denies anxiety or depressive symptoms.   VITAL SIGNS:  Blood pressure (!) 150/80, pulse 69, temperature 98.5 F (36.9 C), temperature source Oral, resp. rate 18, height 5\' 11"  (1.803 m), weight 136.1 kg, SpO2 98 %.  I/O:  No  intake or output data in the 24 hours ending 01/09/19 1458  PHYSICAL EXAMINATION:  GENERAL:  58 y.o.-year-old patient lying in the bed with no acute distress.  EYES: Pupils equal, round, reactive to light and accommodation. No scleral icterus. Extraocular muscles intact.  HEENT: Head atraumatic, normocephalic. Oropharynx and nasopharynx clear.  NECK:  Supple, no jugular venous distention. No thyroid enlargement, no tenderness.  LUNGS: Normal breath sounds bilaterally, no wheezing, rales,rhonchi or crepitation. No use of accessory muscles of respiration.  CARDIOVASCULAR: S1, S2 normal. No murmurs, rubs, or gallops.  ABDOMEN: Soft, non-tender, non-distended. Bowel sounds present. No organomegaly or mass.  EXTREMITIES: No pedal edema, cyanosis, or clubbing.  NEUROLOGIC: Cranial nerves II through XII are intact. Muscle strength 5/5 in all extremities. Sensation intact. Gait not checked.  PSYCHIATRIC: The patient is alert and oriented x 3.  SKIN: No obvious rash, lesion, or ulcer.   DATA REVIEW:   CBC Recent Labs  Lab 01/09/19 0455 01/09/19 1410  WBC 6.2  --   HGB 7.5* 8.1*  HCT 23.7* 25.5*  PLT 186  --     Chemistries  Recent Labs  Lab 01/02/19 1922  01/04/19 0423  NA 138   < > 140  K 3.7   < > 3.7  CL 109   < > 111  CO2 20*   < > 24  GLUCOSE 146*   < > 149*  BUN 31*   < > 24*  CREATININE 0.93   < > 1.17  CALCIUM 7.7*   < > 8.2*  AST 31  --   --   ALT 29  --   --   ALKPHOS 63  --   --   BILITOT 0.6  --   --    < > = values in this interval not displayed.    Cardiac Enzymes No results for input(s): TROPONINI in the last 168 hours.  Microbiology Results  Results for orders placed or performed during the hospital encounter of 01/02/19  SARS Coronavirus 2 (CEPHEID - Performed in [facility] hospital lab), Hosp Order     Status: None   Collection Time: 01/02/19  9:03 PM   Specimen: Nasopharyngeal Swab  Result Value Ref Range Status   SARS Coronavirus 2 NEGATIVE  NEGATIVE Final    Comment: (NOTE) If result is NEGATIVE SARS-CoV-2 target nucleic acids are NOT DETECTED. The SARS-CoV-2 RNA is generally detectable in upper and lower  respiratory specimens during the acute phase of infection. The lowest  concentration of SARS-CoV-2 viral copies this assay can detect is 250  copies / mL. A negative result does not preclude SARS-CoV-2 infection  and should not be used as the sole basis for treatment or other  patient management decisions.  A negative result may  occur with  improper specimen collection / handling, submission of specimen other  than nasopharyngeal swab, presence of viral mutation(s) within the  areas targeted by this assay, and inadequate number of viral copies  (<250 copies / mL). A negative result must be combined with clinical  observations, patient history, and epidemiological information. If result is POSITIVE SARS-CoV-2 target nucleic acids are DETECTED. The SARS-CoV-2 RNA is generally detectable in upper and lower  respiratory specimens dur ing the acute phase of infection.  Positive  results are indicative of active infection with SARS-CoV-2.  Clinical  correlation with patient history and other diagnostic information is  necessary to determine patient infection status.  Positive results do  not rule out bacterial infection or co-infection with other viruses. If result is PRESUMPTIVE POSTIVE SARS-CoV-2 nucleic acids MAY BE PRESENT.   A presumptive positive result was obtained on the submitted specimen  and confirmed on repeat testing.  While 2019 novel coronavirus  (SARS-CoV-2) nucleic acids may be present in the submitted sample  additional confirmatory testing may be necessary for epidemiological  and / or clinical management purposes  to differentiate between  SARS-CoV-2 and other Sarbecovirus currently known to infect humans.  If clinically indicated additional testing with an alternate test  methodology [phone]) is  advised. The SARS-CoV-2 RNA is generally  detectable in upper and lower respiratory sp ecimens during the acute  phase of infection. The expected result is Negative. Fact Sheet for Patients:  StrictlyIdeas.no Fact Sheet for Healthcare Providers: BankingDealers.co.za This test is not yet approved or cleared by the [place] FDA and has been authorized for detection and/or diagnosis of SARS-CoV-2 by FDA under an Emergency Use Authorization (EUA).  This EUA will remain in effect (meaning this test can be used) for the duration of the COVID-19 declaration under Section 564(b)(1) of the Act, 21 U.S.C. section 360bbb-3(b)(1), unless the authorization is terminated or revoked sooner. Performed at [facility], [address] [place], [place] [address]     RADIOLOGY:  Nm Gi Blood Loss  Result Date: 01/07/2019 CLINICAL DATA:  Blood in stool EXAM: NUCLEAR MEDICINE GASTROINTESTINAL BLEEDING SCAN TECHNIQUE: Sequential abdominal images were obtained following intravenous administration of Tc-62m labeled red blood cells. RADIOPHARMACEUTICALS:  26.6 mCi Tc-28m pertechnetate in-vitro labeled red cells. COMPARISON:  None. FINDINGS: There is accumulating, peristalsing radiotracer activity in the central upper abdomen, which appears to be within the duodenum or alternately the transverse colon given configuration. There is radiotracer accumulation in the urinary bladder and almost immediately between the thighs. Although this is likely related to urinary excretion, brisk rectal or anal bleeding could have this appearance. IMPRESSION: 1. There is accumulating, peristalsing radiotracer activity in the central upper abdomen, which appears to be within the duodenum or alternately the transverse colon given configuration. 2. There is radiotracer accumulation in the urinary bladder and almost immediately between the thighs. Although this is likely related to  urinary excretion, brisk rectal or anal bleeding could have this appearance. Electronically Signed   By: Eddie Candle M.D.   On: 01/07/2019 13:08    EKG:   Orders placed or performed during the hospital encounter of 01/02/19  . ED EKG  . ED EKG      Management plans discussed with the patient, family and they are in agreement.  CODE STATUS:     Code Status Orders  (From admission, onward)         Start     Ordered   01/02/19 2242  Full code  Continuous     01/02/19 2241        Code Status History    Date Active Date Inactive Code Status Order ID Comments User Context   02/19/2018 1343 02/21/2018 2015 Full Code [id number]  Dereck Leep, MD Inpatient   06/09/2016 1458 06/11/2016 1653 Full Code [id number]  Frederik Pear, MD Inpatient   Advance Care Planning Activity      TOTAL TIME TAKING CARE OF THIS PATIENT: 45 minutes.   Note: This dictation was prepared with Dragon dictation along with smaller phrase technology. Any transcriptional errors that result from this process are unintentional.   @[facility] @  on 01/09/2019 at 2:58 PM  Between 7am to 6pm - Pager - [phone]  After 6pm go to [email or web address] - password [email or web address] [facility]  Office  [phone]  CC: Primary care physician; Juluis Pitch, MD

## 2019-01-10 ENCOUNTER — Telehealth: Payer: Self-pay | Admitting: Gastroenterology

## 2019-01-10 ENCOUNTER — Encounter: Payer: Self-pay | Admitting: Gastroenterology

## 2019-01-10 LAB — BPAM RBC
Blood Product Expiration Date: 202007122359
Blood Product Expiration Date: 202007132359
Blood Product Expiration Date: 202008102359
ISSUE DATE / TIME: 202007121510
Unit Type and Rh: 7300
Unit Type and Rh: 9500
Unit Type and Rh: 9500

## 2019-01-10 LAB — TYPE AND SCREEN
ABO/RH(D): B POS
Antibody Screen: NEGATIVE
Unit division: 0
Unit division: 0
Unit division: 0

## 2019-01-10 LAB — PREPARE RBC (CROSSMATCH)

## 2019-01-10 NOTE — Telephone Encounter (Signed)
Patient called & states he needs to have CBC drown on Tuesday to check his count from his discharge from the hospital 01-09-19. He had procedures by DR Allen Norris on 7-6-&01-04-19  & last had procedures by Dr Vicente Males on 7-9- & 01-08-19.

## 2019-01-11 ENCOUNTER — Other Ambulatory Visit: Payer: Self-pay

## 2019-01-11 DIAGNOSIS — K922 Gastrointestinal hemorrhage, unspecified: Secondary | ICD-10-CM

## 2019-01-11 NOTE — Telephone Encounter (Signed)
Only cbc

## 2019-01-11 NOTE — Telephone Encounter (Signed)
Spoke with pt and informed him that the lab order is in and that he's able to visit our office lab or any [facility] collection site. Pt plans to visit our office this morning.

## 2019-01-12 ENCOUNTER — Encounter: Payer: Self-pay | Admitting: Gastroenterology

## 2019-01-12 LAB — CBC WITH DIFFERENTIAL/PLATELET
Basophils Absolute: 0 10*3/uL (ref 0.0–0.2)
Basos: 1 %
EOS (ABSOLUTE): 0.1 10*3/uL (ref 0.0–0.4)
Eos: 2 %
Hematocrit: 25.4 % — ABNORMAL LOW (ref 37.5–51.0)
Hemoglobin: 8.3 g/dL — ABNORMAL LOW (ref 13.0–17.7)
Immature Grans (Abs): 0 10*3/uL (ref 0.0–0.1)
Immature Granulocytes: 0 %
Lymphocytes Absolute: 1.2 10*3/uL (ref 0.7–3.1)
Lymphs: 21 %
MCH: 29.1 pg (ref 26.6–33.0)
MCHC: 32.7 g/dL (ref 31.5–35.7)
MCV: 89 fL (ref 79–97)
Monocytes Absolute: 0.4 10*3/uL (ref 0.1–0.9)
Monocytes: 7 %
Neutrophils Absolute: 3.9 10*3/uL (ref 1.4–7.0)
Neutrophils: 69 %
Platelets: 240 10*3/uL (ref 150–450)
RBC: 2.85 x10E6/uL — ABNORMAL LOW (ref 4.14–5.80)
RDW: 14.2 % (ref 11.6–15.4)
WBC: 5.6 10*3/uL (ref 3.4–10.8)

## 2019-01-13 ENCOUNTER — Telehealth: Payer: Self-pay | Admitting: Gastroenterology

## 2019-01-13 NOTE — Telephone Encounter (Signed)
Patient would like the results to the labs pt had drawn on Tuesday.

## 2019-01-14 NOTE — Telephone Encounter (Signed)
Connor Brewer: HB is 8.3 and stable- repeat CBC in 2 weeks time  Dr [name] MD,MRCP [facility]) Gastroenterology/Hepatology Pager: [phone]

## 2019-01-17 ENCOUNTER — Other Ambulatory Visit: Payer: Self-pay

## 2019-01-17 DIAGNOSIS — K922 Gastrointestinal hemorrhage, unspecified: Secondary | ICD-10-CM

## 2019-01-17 NOTE — Telephone Encounter (Signed)
Spoke with pt and informed him of lab results and Dr. Georgeann Oppenheim instructions to repeat lab test in 2 weeks. Pt agrees.

## 2019-01-17 NOTE — Progress Notes (Signed)
cbc

## 2019-01-20 ENCOUNTER — Telehealth: Payer: Self-pay | Admitting: Gastroenterology

## 2019-01-20 NOTE — Telephone Encounter (Signed)
Pt is calling Dr. Vicente Males wants him to get Labs drawn and his new Primary care physician has also ordered Lab work he would like to see if we can send our orders to Dr. Netty Starring office at [facility]  Also he just had Labs drawn and his hemoglobin went from 8.3- 9.

## 2019-01-26 ENCOUNTER — Ambulatory Visit (INDEPENDENT_AMBULATORY_CARE_PROVIDER_SITE_OTHER): Payer: Managed Care, Other (non HMO) | Admitting: Gastroenterology

## 2019-01-26 DIAGNOSIS — K922 Gastrointestinal hemorrhage, unspecified: Secondary | ICD-10-CM | POA: Diagnosis not present

## 2019-01-26 DIAGNOSIS — K633 Ulcer of intestine: Secondary | ICD-10-CM | POA: Diagnosis not present

## 2019-01-26 NOTE — Progress Notes (Signed)
Connor Brewer , MD [address]  [place], [place] [address]  Main: [phone]  Fax: [phone]   Primary Care Physician: Dion Body, MD  Virtual Visit via Video Note  I connected with patient on 01/26/19 at 11:00 AM EDT by video and verified that I am speaking with the correct person using two identifiers.   I discussed the limitations, risks, security and privacy concerns of performing an evaluation and management service by video  and the availability of in person appointments. I also discussed with the patient that there may be a patient responsible charge related to this service. The patient expressed understanding and agreed to proceed.  Location of Patient: Home Location of Provider: Home Persons involved: Patient and provider only   History of Present Illness: Chief Complaint  Patient presents with   Follow-up    GI Hemorrhage    HPI: Connor Brewer is a 58 y.o. male is here today to see me for hospital follow-up.  He was recently admitted on 01/02/2019 with melena.  It began a few days prior to his hospitalization.  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  At that point of time the colonoscopy was incomplete when performed by Dr. Gustavo Lah and had to have a barium enema.  On admission hemoglobin was 5.4 g down from 15.5 in August 2019.  He denied any abdominal pain but he had been taking NSAIDs alternating with Tylenol for pain.  He underwent the following test during the hospitalization 01/03/2019 EGD: Erythema in the duodenum otherwise normal 01/04/2019 colonoscopy: Dark material present in the right side of the colon prep was fair.  3 sessile polyps were resected. 01/06/2019: Capsule study of the small bowel showed normal upper GI tract.  But was the distal part of the small bowel the prep was fair with a lot of stool but also appeared darker in color of the areas showing red blood as well.  Unclear if he was bleeding in the distal  part. 01/07/2019 tagged RBC scan was performed as there was a drop in hemoglobin and he demonstrated radiotracer activity in the central abdomen which appears to be within the duodenum or alternately the transverse colon giving the appearance. 01/08/2019: Colonoscopy: No blood was seen anywhere in the colon but there were 3 areas of ulceration discontinuous superficial based in the transverse colon with no active bleeding.  My impression was that he probably had NSAID related colonic ulcers.  It is also possibility that he might have had small bowel ulcers as a capsule study appeared abnormal.  Meckel's diverticulum was the other differential diagnosis.    Interval history   since discharge-01/26/2019  01/20/2019 hemoglobin 9 g.No black stools, No abdominal pain. Stopped all NSAID's    Current Outpatient Medications  Medication Sig Dispense Refill   amLODipine (NORVASC) 10 MG tablet Take 10 mg by mouth daily.      bifidobacterium infantis (ALIGN) capsule Take 1 capsule by mouth daily.     docusate sodium (COLACE) 100 MG capsule Take 1 capsule (100 mg total) by mouth daily. 30 capsule 0   ferrous sulfate 325 (65 FE) MG EC tablet Take 1 tablet (325 mg total) by mouth 2 (two) times daily. 60 tablet 3   fexofenadine (ALLEGRA) 180 MG tablet Take 180 mg by mouth daily.      losartan-hydrochlorothiazide (HYZAAR) 50-12.5 MG tablet Take 1 tablet by mouth daily.      Multiple Vitamins-Minerals (CENTRUM SILVER 50+MEN PO) Take  1 tablet by mouth daily.     Omega-3 Fatty Acids (FISH OIL) 500 MG CAPS Take 500 mg by mouth daily.     pantoprazole (PROTONIX) 40 MG tablet Take 1 tablet (40 mg total) by mouth 2 (two) times daily. 60 tablet 0   albuterol (VENTOLIN HFA) 108 (90 Base) MCG/ACT inhaler INHALE 2 INHALATIONS INTO THE LUNGS EVERY 6 HOURS AS NEEDED FOR SEASONAL     aspirin EC 81 MG tablet Take 1 tablet (81 mg total) by mouth daily. 30 tablet 0   atorvastatin (LIPITOR) 20 MG tablet Take 20 mg  by mouth daily.     metFORMIN (GLUCOPHAGE-XR) 750 MG 24 hr tablet Take 750 mg by mouth daily.  3   No current facility-administered medications for this visit.     Allergies as of 01/26/2019 - Review Complete 01/26/2019  Allergen Reaction Noted   Ivp dye [iodinated diagnostic agents] Anaphylaxis, Swelling, and Other (See Comments) 05/26/2016   Metrizamide Anaphylaxis, Swelling, and Other (See Comments) 05/26/2016    Review of Systems:    All systems reviewed and negative except where noted in HPI.  General Appearance:    Alert, cooperative, no distress, appears stated age  Head:    Normocephalic, without obvious abnormality, atraumatic  Eyes:    PERRL, conjunctiva/corneas clear,  Ears:    Grossly normal hearing    Neurologic:  Grossly normal    Observations/Objective:  Labs: CMP     Component Value Date/Time   NA 140 01/04/2019 0423   NA 138 07/05/2014 0419   K 3.7 01/04/2019 0423   K 3.4 (L) 07/05/2014 0419   CL 111 01/04/2019 0423   CL 103 07/05/2014 0419   CO2 24 01/04/2019 0423   CO2 25 07/05/2014 0419   GLUCOSE 149 (H) 01/04/2019 0423   GLUCOSE 191 (H) 07/05/2014 0419   BUN 24 (H) 01/04/2019 0423   BUN 16 07/05/2014 0419   CREATININE 1.17 01/04/2019 0423   CREATININE 1.10 07/05/2014 0419   CALCIUM 8.2 (L) 01/04/2019 0423   CALCIUM 9.3 07/05/2014 0419   PROT 5.2 (L) 01/02/2019 1922   PROT 8.3 (H) 07/05/2014 0419   ALBUMIN 2.8 (L) 01/02/2019 1922   ALBUMIN 4.0 07/05/2014 0419   AST 31 01/02/2019 1922   AST 44 (H) 07/05/2014 0419   ALT 29 01/02/2019 1922   ALT 58 07/05/2014 0419   ALKPHOS 63 01/02/2019 1922   ALKPHOS 90 07/05/2014 0419   BILITOT 0.6 01/02/2019 1922   BILITOT 0.4 07/05/2014 0419   GFRNONAA >60 01/04/2019 0423   GFRNONAA >60 07/05/2014 0419   GFRNONAA >60 10/07/2013 1135   GFRAA >60 01/04/2019 0423   GFRAA >60 07/05/2014 0419   GFRAA >60 10/07/2013 1135   Lab Results  Component Value Date   WBC 5.6 01/11/2019   HGB 8.3 (L)  01/11/2019   HCT 25.4 (L) 01/11/2019   MCV 89 01/11/2019   PLT 240 01/11/2019    Imaging Studies: Nm Gi Blood Loss  Result Date: 01/07/2019 CLINICAL DATA:  Blood in stool EXAM: NUCLEAR MEDICINE GASTROINTESTINAL BLEEDING SCAN TECHNIQUE: Sequential abdominal images were obtained following intravenous administration of Tc-3m labeled red blood cells. RADIOPHARMACEUTICALS:  26.6 mCi Tc-39m pertechnetate in-vitro labeled red cells. COMPARISON:  None. FINDINGS: There is accumulating, peristalsing radiotracer activity in the central upper abdomen, which appears to be within the duodenum or alternately the transverse colon given configuration. There is radiotracer accumulation in the urinary bladder and almost immediately between the thighs. Although this is likely related to  urinary excretion, brisk rectal or anal bleeding could have this appearance. IMPRESSION: 1. There is accumulating, peristalsing radiotracer activity in the central upper abdomen, which appears to be within the duodenum or alternately the transverse colon given configuration. 2. There is radiotracer accumulation in the urinary bladder and almost immediately between the thighs. Although this is likely related to urinary excretion, brisk rectal or anal bleeding could have this appearance. Electronically Signed   By: Eddie Candle M.D.   On: 01/07/2019 13:08   Dg Chest Portable 1 View  Result Date: 01/02/2019 CLINICAL DATA:  Shortness of breath EXAM: PORTABLE CHEST 1 VIEW COMPARISON:  July 05, 2014 FINDINGS: The heart size and mediastinal contours are within normal limits. Both lungs are clear. The visualized skeletal structures are unremarkable. IMPRESSION: No active cardiopulmonary disease. Electronically Signed   By: Abelardo Diesel M.D.   On: 01/02/2019 20:21    Assessment and Plan:   ALEXEI DOSWELL is a 58 y.o. y/o male here today to follow-up after his hospital discharge.  He was admitted with melena and 01/17/2019, initial  EGD+colonoscopynormal. Last colonoscopy performed 01/08/2019 after tagged RBC scan showed possible bleed in the Transverse colon and showed small ulcers in the transverse colon. Capsule study didn't show a bleed in the dodenum/proximal jejunum, visualization after was impaired due to liquid stool and some dark material which may have been old or new blood.   Plan 1.  Stop all NSAIDs. 2.  Check CBC to ensure stable. 3.  Discussed that it may be worthwhile to repeat colonoscopy in 6 to 8 weeks to ensure the ulceration seen in the transverse colon has resolved and is not related to any pathology such as neoplasm or Crohn's disease. 3.  If bleeding were to occur again may need to have a low threshold to repeat capsule study of the small bowel plus or minus Meckel's scan.   I have discussed alternative options, risks & benefits,  which include, but are not limited to, bleeding, infection, perforation,respiratory complication & drug reaction.  The patient agrees with this plan & written consent will be obtained.       I discussed the assessment and treatment plan with the patient. The patient was provided an opportunity to ask questions and all were answered. The patient agreed with the plan and demonstrated an understanding of the instructions.   The patient was advised to call back or seek an in-person evaluation if the symptoms worsen or if the condition fails to improve as anticipated.   Dr Connor Bellows MD,MRCP [facility]) Gastroenterology/Hepatology Pager: [phone]   Speech recognition software was used to dictate this note.

## 2019-02-02 ENCOUNTER — Other Ambulatory Visit: Payer: Self-pay

## 2019-02-02 ENCOUNTER — Telehealth: Payer: Self-pay | Admitting: Gastroenterology

## 2019-02-02 DIAGNOSIS — K922 Gastrointestinal hemorrhage, unspecified: Secondary | ICD-10-CM

## 2019-02-02 DIAGNOSIS — K633 Ulcer of intestine: Secondary | ICD-10-CM

## 2019-02-02 NOTE — Telephone Encounter (Signed)
Spoke with pt and informed him that we have faxed to lab order to his pcp.

## 2019-02-02 NOTE — Telephone Encounter (Signed)
Patient called & wanted to know if he needs his labs drawn this week. He normally has been having them drawn on Thursday. If so please send order to Dr Everlene Balls @ [facility] to put in the order to have drawn in their las as was done last week. Please notify patient.

## 2019-02-09 ENCOUNTER — Encounter: Payer: Self-pay | Admitting: Family Medicine

## 2019-04-25 ENCOUNTER — Other Ambulatory Visit: Payer: Self-pay

## 2019-04-25 MED ORDER — NA SULFATE-K SULFATE-MG SULF 17.5-3.13-1.6 GM/177ML PO SOLN: 1.0000 | Freq: Once | ORAL | 0 refills | Status: AC

## 2019-04-25 NOTE — Telephone Encounter (Signed)
Spoke with pt and reminded him that he would need to go for his COVID test on 05-02-19 between 10:30 am - 12:30 pm. Pt states he wouldn't be able to make it for the COVID test until late afternoon that day due to a work meeting. Pt requested to go for the test on this Friday instead. I confirmed with [facility] that pt would be okay to have COVID test on 04-29-19 for the 05-05-19 procedure. Pt has been informed and is also aware that we have sent the Suprep bowel prep prescription.

## 2019-05-02 ENCOUNTER — Other Ambulatory Visit: Payer: Managed Care, Other (non HMO)

## 2019-05-02 ENCOUNTER — Inpatient Hospital Stay: Admission: RE | Admit: 2019-05-02 | Payer: Managed Care, Other (non HMO) | Source: Ambulatory Visit

## 2019-05-03 ENCOUNTER — Other Ambulatory Visit
Admission: RE | Admit: 2019-05-03 | Discharge: 2019-05-03 | Disposition: A | Discharge status: Home / Self Care | Payer: Managed Care, Other (non HMO) | Source: Ambulatory Visit | Attending: Gastroenterology | Admitting: Gastroenterology

## 2019-05-03 ENCOUNTER — Other Ambulatory Visit: Payer: Self-pay

## 2019-05-03 ENCOUNTER — Other Ambulatory Visit: Payer: Managed Care, Other (non HMO)

## 2019-05-03 DIAGNOSIS — Z20828 Contact with and (suspected) exposure to other viral communicable diseases: Secondary | ICD-10-CM | POA: Insufficient documentation

## 2019-05-03 DIAGNOSIS — Z01812 Encounter for preprocedural laboratory examination: Secondary | ICD-10-CM | POA: Diagnosis not present

## 2019-05-04 LAB — SARS CORONAVIRUS 2 (TAT 6-24 HRS): SARS Coronavirus 2: NEGATIVE

## 2019-05-05 ENCOUNTER — Ambulatory Visit: Payer: Managed Care, Other (non HMO) | Admitting: Certified Registered"

## 2019-05-05 ENCOUNTER — Ambulatory Visit
Admission: RE | Admit: 2019-05-05 | Discharge: 2019-05-05 | Disposition: A | Discharge status: Home / Self Care | Payer: Managed Care, Other (non HMO) | Attending: Gastroenterology | Admitting: Gastroenterology

## 2019-05-05 ENCOUNTER — Encounter
Admission: RE | Disposition: A | Discharge status: Home / Self Care | Payer: Self-pay | Source: Home / Self Care | Attending: Gastroenterology

## 2019-05-05 ENCOUNTER — Other Ambulatory Visit: Payer: Self-pay

## 2019-05-05 DIAGNOSIS — K635 Polyp of colon: Secondary | ICD-10-CM

## 2019-05-05 DIAGNOSIS — Z96641 Presence of right artificial hip joint: Secondary | ICD-10-CM | POA: Diagnosis not present

## 2019-05-05 DIAGNOSIS — E669 Obesity, unspecified: Secondary | ICD-10-CM | POA: Insufficient documentation

## 2019-05-05 DIAGNOSIS — Z86718 Personal history of other venous thrombosis and embolism: Secondary | ICD-10-CM | POA: Insufficient documentation

## 2019-05-05 DIAGNOSIS — I10 Essential (primary) hypertension: Secondary | ICD-10-CM | POA: Insufficient documentation

## 2019-05-05 DIAGNOSIS — Z09 Encounter for follow-up examination after completed treatment for conditions other than malignant neoplasm: Secondary | ICD-10-CM | POA: Insufficient documentation

## 2019-05-05 DIAGNOSIS — Z7984 Long term (current) use of oral hypoglycemic drugs: Secondary | ICD-10-CM | POA: Diagnosis not present

## 2019-05-05 DIAGNOSIS — J449 Chronic obstructive pulmonary disease, unspecified: Secondary | ICD-10-CM | POA: Diagnosis not present

## 2019-05-05 DIAGNOSIS — Z79899 Other long term (current) drug therapy: Secondary | ICD-10-CM | POA: Insufficient documentation

## 2019-05-05 DIAGNOSIS — J302 Other seasonal allergic rhinitis: Secondary | ICD-10-CM | POA: Insufficient documentation

## 2019-05-05 DIAGNOSIS — G4730 Sleep apnea, unspecified: Secondary | ICD-10-CM | POA: Insufficient documentation

## 2019-05-05 DIAGNOSIS — E119 Type 2 diabetes mellitus without complications: Secondary | ICD-10-CM | POA: Insufficient documentation

## 2019-05-05 DIAGNOSIS — Z6841 Body Mass Index (BMI) 40.0 and over, adult: Secondary | ICD-10-CM | POA: Diagnosis not present

## 2019-05-05 DIAGNOSIS — Z7982 Long term (current) use of aspirin: Secondary | ICD-10-CM | POA: Diagnosis not present

## 2019-05-05 DIAGNOSIS — K5730 Diverticulosis of large intestine without perforation or abscess without bleeding: Secondary | ICD-10-CM | POA: Insufficient documentation

## 2019-05-05 DIAGNOSIS — K219 Gastro-esophageal reflux disease without esophagitis: Secondary | ICD-10-CM | POA: Insufficient documentation

## 2019-05-05 DIAGNOSIS — K633 Ulcer of intestine: Secondary | ICD-10-CM

## 2019-05-05 DIAGNOSIS — K922 Gastrointestinal hemorrhage, unspecified: Secondary | ICD-10-CM

## 2019-05-05 HISTORY — PX: COLONOSCOPY WITH PROPOFOL: SHX5780

## 2019-05-05 SURGERY — COLONOSCOPY WITH PROPOFOL
Anesthesia: General

## 2019-05-05 MED ORDER — GLYCOPYRROLATE 0.2 MG/ML IJ SOLN
INTRAMUSCULAR | Status: DC | PRN
  Administered 2019-05-05: 0.2 mg via INTRAVENOUS

## 2019-05-05 MED ORDER — SODIUM CHLORIDE 0.9 % IV SOLN
INTRAVENOUS | Status: DC
  Administered 2019-05-05 (×2): via INTRAVENOUS

## 2019-05-05 MED ORDER — PROPOFOL 500 MG/50ML IV EMUL
INTRAVENOUS | Status: DC | PRN
  Administered 2019-05-05: 150 ug/kg/min via INTRAVENOUS

## 2019-05-05 MED ORDER — PROPOFOL 10 MG/ML IV BOLUS
INTRAVENOUS | Status: DC | PRN
  Administered 2019-05-05: 60 mg via INTRAVENOUS
  Administered 2019-05-05: 10 mg via INTRAVENOUS
  Administered 2019-05-05: 20 mg via INTRAVENOUS

## 2019-05-05 MED ORDER — LIDOCAINE HCL (CARDIAC) PF 100 MG/5ML IV SOSY
PREFILLED_SYRINGE | INTRAVENOUS | Status: DC | PRN
  Administered 2019-05-05: 100 mg via INTRATRACHEAL

## 2019-05-05 NOTE — Op Note (Signed)
[facility] Gastroenterology Patient Name: Connor Brewer Procedure Date: 05/05/2019 9:09 AM MRN: [id number] Account #: [id number] Date of Birth: [birth year] Admit Type: Outpatient Age: 58 Room: [facility] ENDO ROOM 4 Gender: Male Note Status: Finalized Procedure:             Colonoscopy Indications:           Suspected Crohn's disease of the colon Providers:             [name] MD, MD Referring MD:          Dion Body (Referring MD) Medicines:             Monitored Anesthesia Care Complications:         No immediate complications. Procedure:             Pre-Anesthesia Assessment:                        - Prior to the procedure, a History and Physical was                         performed, and patient medications, allergies and                         sensitivities were reviewed. The patient's tolerance                         of previous anesthesia was reviewed.                        - The risks and benefits of the procedure and the                         sedation options and risks were discussed with the                         patient. All questions were answered and informed                         consent was obtained.                        - After reviewing the risks and benefits, the patient                         was deemed in satisfactory condition to undergo the                         procedure.                        - ASA Grade Assessment: III - A patient with severe                         systemic disease.                        After obtaining informed consent, the colonoscope was                         passed under direct vision. Throughout the procedure,  the patient's blood pressure, pulse, and oxygen                         saturations were monitored continuously. The                         Colonoscope was introduced through the anus and                         advanced to the the cecum, identified by the                 appendiceal orifice. The colonoscopy was performed                         with ease. The patient tolerated the procedure well.                         The quality of the bowel preparation was excellent. Findings:      The perianal and digital rectal examinations were normal.      Three sessile polyps were found in the rectum, sigmoid colon and       ascending colon. The polyps were 4 to 6 mm in size. These polyps were       removed with a cold snare. Resection and retrieval were complete.      Three sessile polyps were found in the transverse colon. The polyps were       4 to 6 mm in size. These polyps were removed with a cold snare.       Resection and retrieval were complete.      A few small-mouthed diverticula were found in the sigmoid colon.      The exam was otherwise without abnormality on direct and retroflexion       views. Impression:            - Three 4 to 6 mm polyps in the rectum, in the sigmoid                         colon and in the ascending colon, removed with a cold                         snare. Resected and retrieved.                        - Three 4 to 6 mm polyps in the transverse colon,                         removed with a cold snare. Resected and retrieved.                        - Diverticulosis in the sigmoid colon.                        - The examination was otherwise normal on direct and                         retroflexion views. Recommendation:        - Discharge patient to home (with escort).                        -  Resume previous diet.                        - Continue present medications.                        - Await pathology results.                        - Repeat colonoscopy for surveillance based on                         pathology results. Procedure Code(s):     --- Professional ---                        [phone], Colonoscopy, flexible; with removal of                         tumor(s), polyp(s), or other lesion(s) by snare                          technique Diagnosis Code(s):     --- Professional ---                        K62.1, Rectal polyp                        K63.5, Polyp of colon                        K57.30, Diverticulosis of large intestine without                         perforation or abscess without bleeding CPT copyright 2019 American Medical Association. All rights reserved. The codes documented in this report are preliminary and upon coder review may  be revised to meet current compliance requirements. [name], MD [name] MD, MD 05/05/2019 9:32:06 AM This report has been signed electronically. Number of Addenda: 0 Note Initiated On: 05/05/2019 9:09 AM Scope Withdrawal Time: 0 hours 13 minutes 5 seconds  Total Procedure Duration: 0 hours 15 minutes 15 seconds  Estimated Blood Loss:  Estimated blood loss: none.      [facility]

## 2019-05-05 NOTE — Anesthesia Post-op Follow-up Note (Signed)
Anesthesia QCDR form completed.        

## 2019-05-05 NOTE — Anesthesia Postprocedure Evaluation (Signed)
Anesthesia Post Note  Patient: Connor Brewer  Procedure(s) Performed: COLONOSCOPY WITH PROPOFOL (N/A )  Patient location during evaluation: Endoscopy Anesthesia Type: General Level of consciousness: awake and alert Pain management: pain level controlled Vital Signs Assessment: post-procedure vital signs reviewed and stable Respiratory status: spontaneous breathing, nonlabored ventilation, respiratory function stable and patient connected to nasal cannula oxygen Cardiovascular status: blood pressure returned to baseline and stable Postop Assessment: no apparent nausea or vomiting Anesthetic complications: no     Last Vitals:  Vitals:   05/05/19 0953 05/05/19 1003  BP: (!) 143/74 136/82  Pulse:    Resp:    Temp:    SpO2:      Last Pain:  Vitals:   05/05/19 1003  TempSrc:   PainSc: 0-No pain                 Martha Clan

## 2019-05-05 NOTE — Anesthesia Preprocedure Evaluation (Signed)
Anesthesia Evaluation  Patient identified by MRN, date of birth, ID band Patient awake    Reviewed: Allergy & Precautions, NPO status , Patient's Chart, lab work & pertinent test results  History of Anesthesia Complications (+) PONV and history of anesthetic complications  Airway Mallampati: III       Dental  (+) Dental Advidsory Given   Pulmonary sleep apnea and Continuous Positive Airway Pressure Ventilation , COPD,  COPD inhaler, Current Smoker and Patient abstained from smoking.,    Pulmonary exam normal        Cardiovascular hypertension, Pt. on medications (-) Past MI and (-) CHF Normal cardiovascular exam(-) dysrhythmias (-) Valvular Problems/Murmurs     Neuro/Psych neg Seizures    GI/Hepatic Neg liver ROS, GERD  Medicated and Controlled,  Endo/Other  diabetes, Type 2, Oral Hypoglycemic AgentsMorbid obesity  Renal/GU negative Renal ROS  negative genitourinary   Musculoskeletal  (+) Arthritis , Osteoarthritis,    Abdominal Normal abdominal exam  (+)   Peds negative pediatric ROS (+)  Hematology   Anesthesia Other Findings Past Medical History: No date: Bronchitis     Comment:  " aalergy driven" No date: Diabetes mellitus without complication (HCC) No date: DVT (deep venous thrombosis) (HCC)     Comment:  LLE No date: GERD (gastroesophageal reflux disease) No date: H/O seasonal allergies No date: Hypertension No date: Obesity No date: Osteoarthritis     Comment:  right hip No date: PONV (postoperative nausea and vomiting) No date: Pre-diabetes No date: Sleep apnea     Comment:  wears CPAP   Reproductive/Obstetrics                             Anesthesia Physical  Anesthesia Plan  ASA: III  Anesthesia Plan: General   Post-op Pain Management:    Induction: Intravenous  PONV Risk Score and Plan: Propofol infusion  Airway Management Planned: Nasal Cannula and Natural  Airway  Additional Equipment:   Intra-op Plan:   Post-operative Plan:   Informed Consent: I have reviewed the patients History and Physical, chart, labs and discussed the procedure including the risks, benefits and alternatives for the proposed anesthesia with the patient or authorized representative who has indicated his/her understanding and acceptance.       Plan Discussed with: CRNA and Surgeon  Anesthesia Plan Comments:         Anesthesia Quick Evaluation

## 2019-05-05 NOTE — Transfer of Care (Signed)
Immediate Anesthesia Transfer of Care Note  Patient: Connor Brewer  Procedure(s) Performed: COLONOSCOPY WITH PROPOFOL (N/A )  Patient Location: Endoscopy Unit  Anesthesia Type:General  Level of Consciousness: awake, oriented and patient cooperative  Airway & Oxygen Therapy: Patient Spontanous Breathing and Patient connected to face mask oxygen  Post-op Assessment: Report given to RN and Post -op Vital signs reviewed and stable  Post vital signs: Reviewed and stable  Last Vitals:  Vitals Value Taken Time  BP 133/68 05/05/19 0934  Temp 36.7 C 05/05/19 0933  Pulse 58 05/05/19 0936  Resp 18 05/05/19 0936  SpO2 96 % 05/05/19 0936  Vitals shown include unvalidated device data.  Last Pain:  Vitals:   05/05/19 0827  TempSrc: Temporal  PainSc: 0-No pain         Complications: No apparent anesthesia complications

## 2019-05-05 NOTE — H&P (Signed)
Connor Bellows, MD [address], [place], [place], [place], [address] [address], [place], [place], [place], [address] Phone: [phone]  Fax: [phone]  Primary Care Physician:  Dion Body, MD   Pre-Procedure History & Physical: HPI:  Connor Brewer is a 58 y.o. male is here for an colonoscopy.   Past Medical History:  Diagnosis Date  . Bronchitis    " aalergy driven"  . Diabetes mellitus without complication ([place])   . DVT (deep venous thrombosis) (HCC)    LLE  . GERD (gastroesophageal reflux disease)   . H/O seasonal allergies   . Hypertension   . Obesity   . Osteoarthritis    right hip  . PONV (postoperative nausea and vomiting)   . Pre-diabetes   . Sleep apnea    wears CPAP    Past Surgical History:  Procedure Laterality Date  . BACK SURGERY  2014   laser spine  . COLONOSCOPY W/ BIOPSIES AND POLYPECTOMY    . COLONOSCOPY WITH PROPOFOL N/A 01/04/2019   Procedure: COLONOSCOPY WITH PROPOFOL;  Surgeon: Lucilla Lame, MD;  Location: [facility] ENDOSCOPY;  Service: Endoscopy;  Laterality: N/A;  . COLONOSCOPY WITH PROPOFOL N/A 01/08/2019   Procedure: COLONOSCOPY WITH PROPOFOL;  Surgeon: Connor Bellows, MD;  Location: [facility] ENDOSCOPY;  Service: Gastroenterology;  Laterality: N/A;  . ENTEROSCOPY  01/08/2019   Procedure: ENTEROSCOPY;  Surgeon: Connor Bellows, MD;  Location: [facility] ENDOSCOPY;  Service: Gastroenterology;;  . ESOPHAGOGASTRODUODENOSCOPY (EGD) WITH PROPOFOL N/A 01/03/2019   Procedure: ESOPHAGOGASTRODUODENOSCOPY (EGD) WITH PROPOFOL;  Surgeon: Lucilla Lame, MD;  Location: [facility] ENDOSCOPY;  Service: Endoscopy;  Laterality: N/A;  . FRACTURE SURGERY  [phone]   right arm, left knee, right knee (rod in right arm, screws in right knee, one screw in left knee)  . GIVENS CAPSULE STUDY N/A 01/06/2019   Procedure: GIVENS CAPSULE STUDY;  Surgeon: Connor Bellows, MD;  Location: [facility] ENDOSCOPY;  Service: Gastroenterology;  Laterality: N/A;  . KNEE ARTHROPLASTY Left 02/19/2018   Procedure: COMPUTER ASSISTED TOTAL KNEE ARTHROPLASTY;  Surgeon: Dereck Leep, MD;  Location: [facility] ORS;  Service: Orthopedics;  Laterality: Left;  Marland Kitchen MULTIPLE TOOTH EXTRACTIONS    . TOTAL HIP ARTHROPLASTY Right 06/09/2016   Procedure: TOTAL HIP ARTHROPLASTY ANTERIOR APPROACH;  Surgeon: Frederik Pear, MD;  Location: [place];  Service: Orthopedics;  Laterality: Right;    Prior to Admission medications   Medication Sig Start Date End Date Taking? Authorizing Provider  amLODipine (NORVASC) 10 MG tablet Take 10 mg by mouth daily.    Yes [provider]  atorvastatin (LIPITOR) 20 MG tablet Take 20 mg by mouth daily.   Yes [provider]  bifidobacterium infantis (ALIGN) capsule Take 1 capsule by mouth daily.   Yes [provider]  docusate sodium (COLACE) 100 MG capsule Take 1 capsule (100 mg total) by mouth daily. 01/09/19  Yes Gouru, Illene Silver, MD  ferrous sulfate 325 (65 FE) MG EC tablet Take 1 tablet (325 mg total) by mouth 2 (two) times daily. 01/09/19 01/09/20 Yes Gouru, Aruna, MD  fexofenadine (ALLEGRA) 180 MG tablet Take 180 mg by mouth daily.    Yes [provider]  losartan-hydrochlorothiazide (HYZAAR) 50-12.5 MG tablet Take 1 tablet by mouth daily.    Yes [provider]  Multiple Vitamins-Minerals (CENTRUM SILVER 50+MEN PO) Take 1 tablet by mouth daily.   Yes [provider]  Omega-3 Fatty Acids (FISH OIL) 500 MG CAPS Take 500 mg by mouth daily.   Yes [provider]  pantoprazole (PROTONIX) 40 MG tablet Take 1 tablet (40 mg total) by mouth 2 (two) times daily. 01/09/19  Yes Gouru, Aruna, MD  albuterol (VENTOLIN HFA) 108 (90 Base) MCG/ACT inhaler INHALE 2 INHALATIONS INTO THE LUNGS EVERY 6 HOURS AS NEEDED FOR SEASONAL 05/05/16   [provider]  aspirin EC 81 MG tablet Take 1 tablet (81 mg total) by mouth daily. Patient not taking: Reported on 05/05/2019 01/10/19 01/10/20  Nicholes Mango, MD  metFORMIN (GLUCOPHAGE-XR) 750 MG 24 hr  tablet Take 750 mg by mouth daily. 01/17/17   [provider]    Allergies as of 02/02/2019 - Review Complete 01/26/2019  Allergen Reaction Noted  . Ivp dye [iodinated diagnostic agents] Anaphylaxis, Swelling, and Other (See Comments) 05/26/2016  . Metrizamide Anaphylaxis, Swelling, and Other (See Comments) 05/26/2016    Family History  Problem Relation Age of Onset  . Lung cancer Father   . Cancer Brother     Social History   Socioeconomic History  . Marital status: Married    Spouse name: Connor Brewer  . Number of children: 2  . Years of education: Not on file  . Highest education level: Not on file  Occupational History  . Not on file  Social Needs  . Financial resource strain: Not very hard  . Food insecurity    Worry: Never true    Inability: Not on file  . Transportation needs    Medical: No    Non-medical: No  Tobacco Use  . Smoking status: Current Some Day Smoker    Types: Cigars  . Smokeless tobacco: Never Used  Substance and Sexual Activity  . Alcohol use: Yes    Comment: social  . Drug use: No  . Sexual activity: Not on file  Lifestyle  . Physical activity    Days per week: 0 days    Minutes per session: 0 min  . Stress: Not at all  Relationships  . Social Herbalist on phone: Patient refused    Gets together: Patient refused    Attends religious service: Patient refused    Active member of club or organization: Patient refused    Attends meetings of clubs or organizations: Patient refused    Relationship status: Patient refused  . Intimate partner violence    Fear of current or ex partner: No    Emotionally abused: No    Physically abused: No    Forced sexual activity: No  Other Topics Concern  . Not on file  Social History Narrative  . Not on file    Review of Systems: See HPI, otherwise negative ROS  Physical Exam: BP (!) 144/83   Pulse 63   Temp (!) 97.4 F (36.3 C) (Temporal)   Resp 17   Ht 5\' 11"  (1.803 m)   Wt  136.1 kg   SpO2 97%   BMI 41.84 kg/m  General:   Alert,  pleasant and cooperative in NAD Head:  Normocephalic and atraumatic. Neck:  Supple; no masses or thyromegaly. Lungs:  Clear throughout to auscultation, normal respiratory effort.    Heart:  +S1, +S2, Regular rate and rhythm, No edema. Abdomen:  Soft, nontender and nondistended. Normal bowel sounds, without guarding, and without rebound.   Neurologic:  Alert and  oriented x4;  grossly normal neurologically.  Impression/Plan: Dillard Essex is here for an colonoscopy to be performed to evaluate colon ulcers seen on last colonoscopy . Risks, benefits, limitations, and alternatives regarding  colonoscopy have been reviewed  with the patient.  Questions have been answered.  All parties agreeable.   Connor Bellows, MD  05/05/2019, 9:02 AM

## 2019-05-06 ENCOUNTER — Encounter: Payer: Self-pay | Admitting: Gastroenterology

## 2019-05-06 LAB — SURGICAL PATHOLOGY

## 2019-05-31 ENCOUNTER — Encounter: Payer: Self-pay | Admitting: Gastroenterology

## 2019-09-04 ENCOUNTER — Ambulatory Visit: Payer: Managed Care, Other (non HMO) | Attending: Internal Medicine

## 2019-09-04 DIAGNOSIS — Z23 Encounter for immunization: Secondary | ICD-10-CM | POA: Insufficient documentation

## 2019-09-04 NOTE — Progress Notes (Signed)
   Covid-19 Vaccination Clinic  Name:  Connor Brewer    MRN: [id number] DOB: [birth year]  09/04/2019  [name] was observed post Covid-19 immunization for 15 minutes without incident. He was provided with Vaccine Information Sheet and instruction to access the V-Safe system.   [name] was instructed to call 911 with any severe reactions post vaccine: Marland Kitchen Difficulty breathing  . Swelling of face and throat  . A fast heartbeat  . A bad rash all over body  . Dizziness and weakness   Immunizations Administered    Name Date Dose VIS Date Route   Pfizer COVID-19 Vaccine 09/04/2019  1:43 PM 0.3 mL 06/10/2019 Intramuscular   Manufacturer: [place]   Lot: [id number]   [place]: [id number]

## 2019-09-28 ENCOUNTER — Ambulatory Visit: Payer: Managed Care, Other (non HMO) | Attending: Internal Medicine

## 2019-09-28 ENCOUNTER — Other Ambulatory Visit: Payer: Self-pay

## 2019-09-28 DIAGNOSIS — Z23 Encounter for immunization: Secondary | ICD-10-CM

## 2019-09-28 NOTE — Progress Notes (Signed)
   Covid-19 Vaccination Clinic  Name:  Connor Brewer    MRN: [id number] DOB: 1961-06-29  09/28/2019  [name] was observed post Covid-19 immunization for 15 minutes without incident. He was provided with Vaccine Information Sheet and instruction to access the V-Safe system.   [name] was instructed to call 911 with any severe reactions post vaccine: Marland Kitchen Difficulty breathing  . Swelling of face and throat  . A fast heartbeat  . A bad rash all over body  . Dizziness and weakness   Immunizations Administered    Name Date Dose VIS Date Route   Pfizer COVID-19 Vaccine 09/28/2019  8:55 AM 0.3 mL 06/10/2019 Intramuscular   Manufacturer: [place]   Lot: [id number]   [place]: [id number]

## 2020-02-25 ENCOUNTER — Other Ambulatory Visit: Payer: Self-pay

## 2020-02-25 ENCOUNTER — Emergency Department
Admission: EM | Admit: 2020-02-25 | Discharge: 2020-02-25 | Disposition: A | Discharge status: Home / Self Care | Payer: Managed Care, Other (non HMO) | Attending: Emergency Medicine | Admitting: Emergency Medicine

## 2020-02-25 DIAGNOSIS — F17290 Nicotine dependence, other tobacco product, uncomplicated: Secondary | ICD-10-CM | POA: Diagnosis not present

## 2020-02-25 DIAGNOSIS — Z7984 Long term (current) use of oral hypoglycemic drugs: Secondary | ICD-10-CM | POA: Insufficient documentation

## 2020-02-25 DIAGNOSIS — I10 Essential (primary) hypertension: Secondary | ICD-10-CM | POA: Insufficient documentation

## 2020-02-25 DIAGNOSIS — E1165 Type 2 diabetes mellitus with hyperglycemia: Secondary | ICD-10-CM | POA: Insufficient documentation

## 2020-02-25 DIAGNOSIS — R739 Hyperglycemia, unspecified: Secondary | ICD-10-CM

## 2020-02-25 DIAGNOSIS — Z79899 Other long term (current) drug therapy: Secondary | ICD-10-CM | POA: Diagnosis not present

## 2020-02-25 LAB — COMPREHENSIVE METABOLIC PANEL
ALT: 90 U/L — ABNORMAL HIGH (ref 0–44)
AST: 84 U/L — ABNORMAL HIGH (ref 15–41)
Albumin: 4.1 g/dL (ref 3.5–5.0)
Alkaline Phosphatase: 59 U/L (ref 38–126)
Anion gap: 13 (ref 5–15)
BUN: 25 mg/dL — ABNORMAL HIGH (ref 6–20)
CO2: 22 mmol/L (ref 22–32)
Calcium: 9.3 mg/dL (ref 8.9–10.3)
Chloride: 93 mmol/L — ABNORMAL LOW (ref 98–111)
Creatinine, Ser: 1.23 mg/dL (ref 0.61–1.24)
GFR calc Af Amer: >60 mL/min (ref >60)
GFR calc non Af Amer: >60 mL/min (ref >60)
Glucose, Bld: 316 mg/dL — ABNORMAL HIGH (ref 70–99)
Potassium: 3.7 mmol/L (ref 3.5–5.1)
Sodium: 128 mmol/L — ABNORMAL LOW (ref 135–145)
Total Bilirubin: 1 mg/dL (ref 0.3–1.2)
Total Protein: 7.6 g/dL (ref 6.5–8.1)

## 2020-02-25 LAB — CBC
HCT: 41.6 % (ref 39.0–52.0)
Hemoglobin: 14.9 g/dL (ref 13.0–17.0)
MCH: 31 pg (ref 26.0–34.0)
MCHC: 35.8 g/dL (ref 30.0–36.0)
MCV: 86.5 fL (ref 80.0–100.0)
Platelets: 171 10*3/uL (ref 150–400)
RBC: 4.81 MIL/uL (ref 4.22–5.81)
RDW: 12.9 % (ref 11.5–15.5)
WBC: 5.4 10*3/uL (ref 4.0–10.5)
nRBC: 0 % (ref 0.0–0.2)

## 2020-02-25 LAB — URINALYSIS, COMPLETE (UACMP) WITH MICROSCOPIC
Bacteria, UA: NONE SEEN
Bilirubin Urine: NEGATIVE
Glucose, UA: >=500 mg/dL — AB
Hgb urine dipstick: NEGATIVE
Ketones, ur: NEGATIVE mg/dL
Leukocytes,Ua: NEGATIVE
Nitrite: NEGATIVE
Protein, ur: NEGATIVE mg/dL
Specific Gravity, Urine: 1.006 (ref 1.005–1.030)
pH: 6 (ref 5.0–8.0)

## 2020-02-25 LAB — GLUCOSE, CAPILLARY
Glucose-Capillary: 347 mg/dL — ABNORMAL HIGH (ref 70–99)
Glucose-Capillary: 285 mg/dL — ABNORMAL HIGH (ref 70–99)
Glucose-Capillary: 274 mg/dL — ABNORMAL HIGH (ref 70–99)

## 2020-02-25 MED ORDER — SODIUM CHLORIDE 0.9 % IV BOLUS
1000.0000 mL | Freq: Once | INTRAVENOUS | Status: AC
  Administered 2020-02-25: 1000 mL via INTRAVENOUS

## 2020-02-25 MED ORDER — METFORMIN HCL 500 MG PO TABS: 500.0000 mg | ORAL_TABLET | Freq: Two times a day (BID) | ORAL | 0 refills | Status: DC

## 2020-02-25 NOTE — ED Notes (Signed)
Pt given a warm blanket 

## 2020-02-25 NOTE — Discharge Instructions (Signed)
Call Dr. Raylene Miyamoto office on Monday to schedule an appointment later this week.  You will need to have a repeat of your blood sugar as well as your liver enzymes which were mildly elevated today.  Start taking the Metformin as prescribed twice daily until you follow-up.  You should check your sugar once or twice daily  Return to the ER for new, worsening, or persistent severe high blood sugar readings, recurrent or worsening symptoms such as generalized weakness, increased thirst and urination, confusion, lightheadedness, or any other new or worsening symptoms or concerning.

## 2020-02-25 NOTE — ED Provider Notes (Signed)
[facility] Emergency Department Provider Note ____________________________________________   First MD Initiated Contact with Patient 02/25/20 2133     (approximate)  I have reviewed the triage vital signs and the nursing notes.   HISTORY  Chief Complaint Hyperglycemia    HPI ASAEL PANN is a 59 y.o. male with PMH as noted below who presents with elevated blood glucose over the last several days with multiple readings ranging from 340s-490s, and associated with increased thirst and frequent urination over the last week.  The patient states he traveled on a trip for work to [place] and the symptoms started around this time.  He denies significant alcohol intake during that period.  The patient states that he was previously on Metformin 750 mg twice daily due to an elevated A1c and was diagnosed with prediabetes.  However, his A1c improved after he lost weight and he was taken off of the Metformin about a year ago.  He took 1 left over pill earlier today but his glucose remained high.  Past Medical History:  Diagnosis Date  . Bronchitis    " aalergy driven"  . Diabetes mellitus without complication ([place])   . DVT (deep venous thrombosis) ([facility])    LLE  . GERD (gastroesophageal reflux disease)   . H/O seasonal allergies   . Hypertension   . Obesity   . Osteoarthritis    right hip  . PONV (postoperative nausea and vomiting)   . Pre-diabetes   . Sleep apnea    wears CPAP    Patient Active Problem List   Diagnosis Date Noted  . Blood in stool   . Polyp of sigmoid colon   . Acute posthemorrhagic anemia   . Other diseases of stomach and duodenum   . GI bleed 01/02/2019  . S/P total knee arthroplasty 02/19/2018  . Primary osteoarthritis of left knee 01/24/2018  . 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 Primary osteoarthritis of right hip 06/06/2016  . Hypertriglyceridemia 05/19/2014  . Morbid obesity, unspecified obesity type ([place]) 05/19/2014  . Type  2 diabetes mellitus without complication ([place]) 42/35/3614  . Allergic rhinitis 10/03/2013  . DVT (deep venous thrombosis) ([place]) 10/03/2013  . Benign essential hypertension 10/03/2013  . History of ulcer disease 10/03/2013  . Osteoarthrosis involving lower leg 10/03/2013    Past Surgical History:  Procedure Laterality Date  . BACK SURGERY  2014   laser spine  . COLONOSCOPY W/ BIOPSIES AND POLYPECTOMY    . COLONOSCOPY WITH PROPOFOL N/A 01/04/2019   Procedure: COLONOSCOPY WITH PROPOFOL;  Surgeon: Lucilla Lame, MD;  Location: [facility] ENDOSCOPY;  Service: Endoscopy;  Laterality: N/A;  . COLONOSCOPY WITH PROPOFOL N/A 01/08/2019   Procedure: COLONOSCOPY WITH PROPOFOL;  Surgeon: [name], MD;  Location: [facility] ENDOSCOPY;  Service: Gastroenterology;  Laterality: N/A;  . COLONOSCOPY WITH PROPOFOL N/A 05/05/2019   Procedure: COLONOSCOPY WITH PROPOFOL;  Surgeon: [name], MD;  Location: [facility] ENDOSCOPY;  Service: Gastroenterology;  Laterality: N/A;  . ENTEROSCOPY  01/08/2019   Procedure: ENTEROSCOPY;  Surgeon: [name], MD;  Location: [facility] ENDOSCOPY;  Service: Gastroenterology;;  . ESOPHAGOGASTRODUODENOSCOPY (EGD) WITH PROPOFOL N/A 01/03/2019   Procedure: ESOPHAGOGASTRODUODENOSCOPY (EGD) WITH PROPOFOL;  Surgeon: Lucilla Lame, MD;  Location: [facility] ENDOSCOPY;  Service: Endoscopy;  Laterality: N/A;  . FRACTURE SURGERY  [phone]   right arm, left knee, right knee (rod in right arm, screws in right knee, one screw in left knee)  . GIVENS CAPSULE STUDY N/A 01/06/2019   Procedure: GIVENS CAPSULE STUDY;  Surgeon:  [name], MD;  Location: [facility] ENDOSCOPY;  Service: Gastroenterology;  Laterality: N/A;  . KNEE ARTHROPLASTY Left 02/19/2018   Procedure: COMPUTER ASSISTED TOTAL KNEE ARTHROPLASTY;  Surgeon: Dereck Leep, MD;  Location: [facility] ORS;  Service: Orthopedics;  Laterality: Left;  Marland Kitchen MULTIPLE TOOTH EXTRACTIONS    . TOTAL HIP ARTHROPLASTY Right 06/09/2016   Procedure: TOTAL HIP ARTHROPLASTY ANTERIOR  APPROACH;  Surgeon: Frederik Pear, MD;  Location: [place];  Service: Orthopedics;  Laterality: Right;    Prior to Admission medications   Medication Sig Start Date End Date Taking? Authorizing Provider  albuterol (VENTOLIN HFA) 108 (90 Base) MCG/ACT inhaler INHALE 2 INHALATIONS INTO THE LUNGS EVERY 6 HOURS AS NEEDED FOR SEASONAL 05/05/16   [provider]  amLODipine (NORVASC) 10 MG tablet Take 10 mg by mouth daily.     [provider]  atorvastatin (LIPITOR) 20 MG tablet Take 20 mg by mouth daily.    [provider]  bifidobacterium infantis (ALIGN) capsule Take 1 capsule by mouth daily.    [provider]  docusate sodium (COLACE) 100 MG capsule Take 1 capsule (100 mg total) by mouth daily. 01/09/19   Nicholes Mango, MD  ferrous sulfate 325 (65 FE) MG EC tablet Take 1 tablet (325 mg total) by mouth 2 (two) times daily. 01/09/19 01/09/20  Nicholes Mango, MD  fexofenadine (ALLEGRA) 180 MG tablet Take 180 mg by mouth daily.     [provider]  losartan-hydrochlorothiazide (HYZAAR) 50-12.5 MG tablet Take 1 tablet by mouth daily.     [provider]  metFORMIN (GLUCOPHAGE) 500 MG tablet Take 1 tablet (500 mg total) by mouth 2 (two) times daily with a meal for 14 days. 02/25/20 03/10/20  Arta Silence, MD  Multiple Vitamins-Minerals (CENTRUM SILVER 50+MEN PO) Take 1 tablet by mouth daily.    [provider]  Omega-3 Fatty Acids (FISH OIL) 500 MG CAPS Take 500 mg by mouth daily.    [provider]  pantoprazole (PROTONIX) 40 MG tablet Take 1 tablet (40 mg total) by mouth 2 (two) times daily. 01/09/19   Nicholes Mango, MD    Allergies Ivp dye [iodinated diagnostic agents] and Metrizamide  Family History  Problem Relation Age of Onset  . Lung cancer Father   . Cancer Brother     Social History Social History   Tobacco Use  . Smoking status: Current Some Day Smoker    Types: Cigars  . Smokeless tobacco: Never Used  Vaping Use    . Vaping Use: Never used  Substance Use Topics  . Alcohol use: Yes    Comment: social  . Drug use: No    Review of Systems  Constitutional: No fever/chills. Eyes: No redness. ENT: No sore throat. Cardiovascular: Denies chest pain. Respiratory: Denies shortness of breath. Gastrointestinal: No vomiting or diarrhea.  Genitourinary: Negative for dysuria.  Positive for polyuria. Musculoskeletal: Negative for back pain. Skin: Negative for rash. Neurological: Negative for headache.   ____________________________________________   PHYSICAL EXAM:  VITAL SIGNS: ED Triage Vitals  Enc Vitals Group     BP 02/25/20 1623 137/60     Pulse Rate 02/25/20 1623 65     Resp 02/25/20 1623 18     Temp 02/25/20 1623 98.9 F (37.2 C)     Temp Source 02/25/20 1623 Oral     SpO2 02/25/20 1623 97 %     Weight 02/25/20 1621 288 lb (130.6 kg)     Height 02/25/20 1621 5\' 11"  (1.803 m)  Head Circumference --      Peak Flow --      Pain Score 02/25/20 1620 0     Pain Loc --      Pain Edu? --      Excl. in [place]? --     Constitutional: Alert and oriented. Well appearing and in no acute distress. Eyes: Conjunctivae are normal.  Head: Atraumatic. Nose: No congestion/rhinnorhea. Mouth/Throat: Mucous membranes are moist.   Neck: Normal range of motion.  Cardiovascular: Normal rate, regular rhythm.  Good peripheral circulation. Respiratory: Normal respiratory effort.  No retractions. Gastrointestinal:  No distention.  Musculoskeletal: Extremities warm and well perfused.  Neurologic:  Normal speech and language. No gross focal neurologic deficits are appreciated.  Skin:  Skin is warm and dry. No rash noted. Psychiatric: Mood and affect are normal. Speech and behavior are normal.  ____________________________________________   LABS (all labs ordered are listed, but only abnormal results are displayed)  Labs Reviewed  URINALYSIS, COMPLETE (UACMP) WITH MICROSCOPIC - Abnormal; Notable for the  following components:      Result Value   Color, Urine STRAW (*)    APPearance CLEAR (*)    Glucose, UA >=500 (*)    All other components within normal limits  COMPREHENSIVE METABOLIC PANEL - Abnormal; Notable for the following components:   Sodium 128 (*)    Chloride 93 (*)    Glucose, Bld 316 (*)    BUN 25 (*)    AST 84 (*)    ALT 90 (*)    All other components within normal limits  GLUCOSE, CAPILLARY - Abnormal; Notable for the following components:   Glucose-Capillary 347 (*)    All other components within normal limits  GLUCOSE, CAPILLARY - Abnormal; Notable for the following components:   Glucose-Capillary 285 (*)    All other components within normal limits  GLUCOSE, CAPILLARY - Abnormal; Notable for the following components:   Glucose-Capillary 274 (*)    All other components within normal limits  CBC  CBG MONITORING, ED  CBG MONITORING, ED   ____________________________________________  EKG   ____________________________________________  RADIOLOGY    ____________________________________________   PROCEDURES  Procedure(s) performed: No  Procedures  Critical Care performed: No ____________________________________________   INITIAL IMPRESSION / ASSESSMENT AND PLAN / ED COURSE  Pertinent labs & imaging results that were available during my care of the patient were reviewed by me and considered in my medical decision making (see chart for details).  59 year old male with PMH as noted above including a history of prediabetes previously on Metformin presents with elevated blood glucose readings over the last several days associated with increased thirst and polyuria over approximately last week.  He was taken off of Metformin about a year ago after his A1c improved when he lost weight.  On exam, the patient is overall well-appearing.  His vital signs are normal.  The physical exam is otherwise unremarkable.  Initial lab work-up obtained from triage  reveals a glucose of 316 but a normal anion gap and no ketones on the UA.  The bicarbonate is normal.  The patient's LFTs are also mildly elevated, of unclear significance.  He has no history of cirrhosis or other liver disease and no right upper quadrant pain, vomiting, or jaundice.  Presentation is consistent with hyperglycemia with no evidence of DKA.  The glucose improved to the 200s with a liter of normal saline.  We will give an additional liter.  There is no indication for treatment with insulin at  this time.  If the glucose improves further, I will plan to restart the patient on Metformin and have him follow-up with his primary care doctor this week.  Although the LFTs are minimally elevated, Metformin is not inherently hepatotoxic and the only real contraindication would be known cirrhosis, which the patient does not have.  ----------------------------------------- 11:25 PM on 02/25/2020 -----------------------------------------  Repeat glucose is 274.  The patient continues to appear comfortable.  He is stable for discharge home at this time.  I have restarted him on Metformin, 500 mg twice daily.  I counseled him on the results of the work-up including the borderline elevated LFTs.  He agrees to follow-up with his PMD this week.  Return precautions given, and he expresses understanding.  ____________________________________________   FINAL CLINICAL IMPRESSION(S) / ED DIAGNOSES  Final diagnoses:  Hyperglycemia      NEW MEDICATIONS STARTED DURING THIS VISIT:  New Prescriptions   METFORMIN (GLUCOPHAGE) 500 MG TABLET    Take 1 tablet (500 mg total) by mouth 2 (two) times daily with a meal for 14 days.     Note:  This document was prepared using Dragon voice recognition software and may include unintentional dictation errors.    Arta Silence, MD 02/25/20 2325

## 2020-02-25 NOTE — ED Triage Notes (Signed)
Pt states for the past week he has been increasingly thirsty, urinary frequency, and weakness- pt states he was classified as a prediabetic and was taken off his metformin about a year ago- pt states his blood sugars for the day are as follows: [phone]

## 2020-11-22 IMAGING — NM NUCLEAR MEDICINE GASTROINTESTINAL BLEEDING STUDY
2 series · 12 of 12 positions shown · non-contrast
Comparison: None.

CLINICAL DATA: Blood in stool

EXAM:
NUCLEAR MEDICINE GASTROINTESTINAL BLEEDING SCAN
TECHNIQUE: Sequential abdominal images were obtained following intravenous
administration of Qc-MMm labeled red blood cells.
RADIOPHARMACEUTICALS:  26.6 mCi Qc-MMm pertechnetate in-vitro
labeled red cells.

[Series 1000: hour 1 gi bleed · 4.80mm/px · 6 of 60 frames shown]
[frame 6/60]
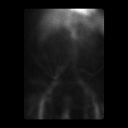
[frame 16/60]
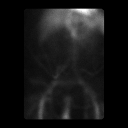
[frame 26/60]
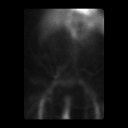
[frame 36/60]
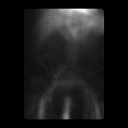
[frame 46/60]
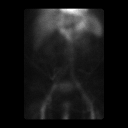
[frame 56/60]
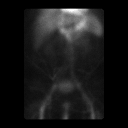

[Series 1000: gi bleed hour 2 · 4.80mm/px · 6 of 60 frames shown]
[frame 6/60]
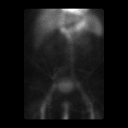
[frame 16/60]
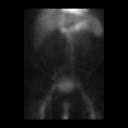
[frame 26/60]
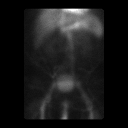
[frame 36/60]
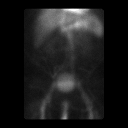
[frame 46/60]
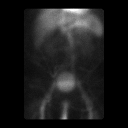
[frame 56/60]
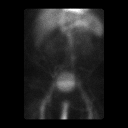

[12 of 12 positions shown; findings below may reference images not displayed]

FINDINGS: There is accumulating, peristalsing radiotracer activity in the
central upper abdomen, which appears to be within the duodenum or
alternately the transverse colon given configuration.

There is radiotracer accumulation in the urinary bladder and almost
immediately between the thighs. Although this is likely related to
urinary excretion, brisk rectal or anal bleeding could have this
appearance.
IMPRESSION: 1. There is accumulating, peristalsing radiotracer activity in the
central upper abdomen, which appears to be within the duodenum or
alternately the transverse colon given configuration.

2. There is radiotracer accumulation in the urinary bladder and
almost immediately between the thighs. Although this is likely
related to urinary excretion, brisk rectal or anal bleeding could
have this appearance.

## 2023-04-10 ENCOUNTER — Other Ambulatory Visit: Payer: Self-pay | Admitting: Otolaryngology

## 2023-04-10 DIAGNOSIS — H902 Conductive hearing loss, unspecified: Secondary | ICD-10-CM

## 2023-04-16 ENCOUNTER — Ambulatory Visit
Admission: RE | Admit: 2023-04-16 | Discharge: 2023-04-16 | Disposition: A | Discharge status: Home / Self Care | Payer: Managed Care, Other (non HMO) | Source: Ambulatory Visit | Attending: Otolaryngology | Admitting: Otolaryngology

## 2023-04-16 DIAGNOSIS — H902 Conductive hearing loss, unspecified: Secondary | ICD-10-CM

## 2023-09-28 ENCOUNTER — Ambulatory Visit: Payer: Managed Care, Other (non HMO) | Admitting: Dermatology

## 2023-09-28 ENCOUNTER — Encounter: Payer: Self-pay | Admitting: Dermatology

## 2023-09-28 DIAGNOSIS — L570 Actinic keratosis: Secondary | ICD-10-CM

## 2023-09-28 DIAGNOSIS — W908XXA Exposure to other nonionizing radiation, initial encounter: Secondary | ICD-10-CM | POA: Diagnosis not present

## 2023-09-28 DIAGNOSIS — L905 Scar conditions and fibrosis of skin: Secondary | ICD-10-CM | POA: Diagnosis not present

## 2023-09-28 DIAGNOSIS — L821 Other seborrheic keratosis: Secondary | ICD-10-CM

## 2023-09-28 DIAGNOSIS — L578 Other skin changes due to chronic exposure to nonionizing radiation: Secondary | ICD-10-CM | POA: Diagnosis not present

## 2023-09-28 NOTE — Patient Instructions (Addendum)

## 2023-09-28 NOTE — Progress Notes (Signed)
 New Patient Visit   Subjective  Connor Brewer is a 63 y.o. male who presents for the following: place at R dorsal hand present x couple months seocnd one coming up as well on same hand. Pt reports hx of skin cancer at leg several years ago removed around 2018.  The patient has spots, moles and lesions to be evaluated, some may be new or changing and the patient may have concern these could be cancer.   The following portions of the chart were reviewed this encounter and updated as appropriate: medications, allergies, medical history  Review of Systems:  No other skin or systemic complaints except as noted in HPI or Assessment and Plan.  Objective  Well appearing patient in no apparent distress; mood and affect are within normal limits.  A focused examination was performed of the following areas: Bilateral hands, bilateral arms  Relevant exam findings are noted in the Assessment and Plan.    R dorsal hand x5 (5) Pink scaly macules  Assessment & Plan   ACTINIC DAMAGE - chronic, secondary to cumulative UV radiation exposure/sun exposure over time - diffuse scaly erythematous macules with underlying dyspigmentation - Recommend daily broad spectrum sunscreen SPF 30+ to sun-exposed areas, reapply every 2 hours as needed.  - Recommend staying in the shade or wearing long sleeves, sun glasses (UVA+UVB protection) and wide brim hats (4-inch brim around the entire circumference of the hat). - Call for new or changing lesions.  SEBORRHEIC KERATOSIS - Stuck-on, waxy, tan-brown papules and/or plaques  - Benign-appearing - Discussed benign etiology and prognosis. - Observe - Call for any changes  SCAR- secondary to injury at R forearm Exam: Dyspigmented smooth macule or patch. Benign-appearing.  Observation.  Call clinic for new or changing lesions. Recommend daily broad spectrum sunscreen SPF 30+, reapply every 2 hours as needed. Treatment: Recommend Serica moisturizing scar formula  cream every night or [employer] brand or Mederma silicone scar sheet every night for the first year after a scar appears to help with scar remodeling if desired. Scars remodel on their own for a full year and will gradually improve in appearance over time.  AK (ACTINIC KERATOSIS) (5) R dorsal hand x5 (5) Actinic keratoses are precancerous spots that appear secondary to cumulative UV radiation exposure/sun exposure over time. They are chronic with expected duration over 1 year. A portion of actinic keratoses will progress to squamous cell carcinoma of the skin. It is not possible to reliably predict which spots will progress to skin cancer and so treatment is recommended to prevent development of skin cancer.  Recommend daily broad spectrum sunscreen SPF 30+ to sun-exposed areas, reapply every 2 hours as needed.  Recommend staying in the shade or wearing long sleeves, sun glasses (UVA+UVB protection) and wide brim hats (4-inch brim around the entire circumference of the hat). Call for new or changing lesions. Destruction of lesion - R dorsal hand x5 (5) Complexity: simple   Destruction method: cryotherapy   Informed consent: discussed and consent obtained   Timeout:  patient name, date of birth, surgical site, and procedure verified Lesion destroyed using liquid nitrogen: Yes   Region frozen until ice ball extended beyond lesion: Yes   Cryo cycles: 1 or 2. Outcome: patient tolerated procedure well with no complications   Post-procedure details: wound care instructions given   ACTINIC ELASTOSIS   SEBORRHEIC KERATOSES   Offered FBSE given hx of skin cancer. Patient prefers PRN follow up  Return if symptoms worsen or fail to  improve.  Wynonia Lawman, CMA, am acting as scribe for Elie Goody, MD .   Documentation: I have reviewed the above documentation for accuracy and completeness, and I agree with the above.  Elie Goody, MD

## 2024-02-11 ENCOUNTER — Other Ambulatory Visit: Payer: Self-pay | Admitting: Family Medicine

## 2024-02-11 DIAGNOSIS — Z9189 Other specified personal risk factors, not elsewhere classified: Secondary | ICD-10-CM

## 2024-02-11 DIAGNOSIS — E782 Mixed hyperlipidemia: Secondary | ICD-10-CM

## 2024-03-11 ENCOUNTER — Ambulatory Visit
Admission: RE | Admit: 2024-03-11 | Discharge: 2024-03-11 | Disposition: A | Discharge status: Home / Self Care | Source: Ambulatory Visit | Attending: Family Medicine | Admitting: Family Medicine

## 2024-03-11 DIAGNOSIS — E782 Mixed hyperlipidemia: Secondary | ICD-10-CM | POA: Insufficient documentation

## 2024-03-11 DIAGNOSIS — Z9189 Other specified personal risk factors, not elsewhere classified: Secondary | ICD-10-CM | POA: Insufficient documentation

## 2024-03-18 ENCOUNTER — Other Ambulatory Visit: Payer: Self-pay | Admitting: Cardiology

## 2024-03-18 DIAGNOSIS — I2510 Atherosclerotic heart disease of native coronary artery without angina pectoris: Secondary | ICD-10-CM

## 2024-03-18 DIAGNOSIS — R0609 Other forms of dyspnea: Secondary | ICD-10-CM

## 2024-03-18 DIAGNOSIS — I10 Essential (primary) hypertension: Secondary | ICD-10-CM

## 2024-03-18 DIAGNOSIS — E782 Mixed hyperlipidemia: Secondary | ICD-10-CM

## 2024-03-18 DIAGNOSIS — Z8249 Family history of ischemic heart disease and other diseases of the circulatory system: Secondary | ICD-10-CM

## 2024-03-18 DIAGNOSIS — E785 Hyperlipidemia, unspecified: Secondary | ICD-10-CM

## 2024-03-29 ENCOUNTER — Encounter (HOSPITAL_COMMUNITY): Payer: Self-pay

## 2024-03-30 ENCOUNTER — Other Ambulatory Visit: Payer: Self-pay | Admitting: Cardiology

## 2024-03-30 DIAGNOSIS — R0609 Other forms of dyspnea: Secondary | ICD-10-CM

## 2024-03-30 NOTE — Progress Notes (Signed)
 Orders only for Cardiac PET stress test.   Danita Bloch, PA-C

## 2024-03-31 ENCOUNTER — Ambulatory Visit
Admission: RE | Admit: 2024-03-31 | Discharge: 2024-03-31 | Disposition: A | Discharge status: Home / Self Care | Source: Ambulatory Visit | Attending: Cardiology | Admitting: Cardiology

## 2024-03-31 DIAGNOSIS — E785 Hyperlipidemia, unspecified: Secondary | ICD-10-CM | POA: Diagnosis not present

## 2024-03-31 DIAGNOSIS — Z8249 Family history of ischemic heart disease and other diseases of the circulatory system: Secondary | ICD-10-CM | POA: Diagnosis not present

## 2024-03-31 DIAGNOSIS — I2510 Atherosclerotic heart disease of native coronary artery without angina pectoris: Secondary | ICD-10-CM | POA: Insufficient documentation

## 2024-03-31 DIAGNOSIS — E1169 Type 2 diabetes mellitus with other specified complication: Secondary | ICD-10-CM | POA: Insufficient documentation

## 2024-03-31 DIAGNOSIS — E782 Mixed hyperlipidemia: Secondary | ICD-10-CM | POA: Diagnosis not present

## 2024-03-31 DIAGNOSIS — R0609 Other forms of dyspnea: Secondary | ICD-10-CM | POA: Diagnosis present

## 2024-03-31 LAB — NM PET CT CARDIAC PERFUSION MULTI W/ABSOLUTE BLOODFLOW
Nuc Rest EF: 63 %
Nuc Stress EF: 68 %
Peak HR: 75 {beats}/min
Rest HR: 61 {beats}/min
Rest Nuclear Isotope Dose: 24.6 mCi
SRS: 0
SSS: 0
ST Depression (mm): 0 mm
Stress Nuclear Isotope Dose: 24.3 mCi
TID: 1.21

## 2024-03-31 MED ORDER — REGADENOSON 0.4 MG/5ML IV SOLN
INTRAVENOUS | Status: AC
  Filled 2024-03-31: qty 5

## 2024-03-31 MED ORDER — RUBIDIUM RB82 GENERATOR (RUBYFILL)
25.0000 | PACK | Freq: Once | INTRAVENOUS | Status: AC
Start: 2024-03-31 — End: 2024-03-31
  Administered 2024-03-31: 24.55 via INTRAVENOUS

## 2024-03-31 MED ORDER — RUBIDIUM RB82 GENERATOR (RUBYFILL)
25.0000 | PACK | Freq: Once | INTRAVENOUS | Status: AC
  Administered 2024-03-31: 24.29 via INTRAVENOUS

## 2024-03-31 MED ORDER — REGADENOSON 0.4 MG/5ML IV SOLN
0.4000 mg | Freq: Once | INTRAVENOUS | Status: AC
  Administered 2024-03-31: 0.4 mg via INTRAVENOUS
  Filled 2024-03-31: qty 5

## 2024-04-18 ENCOUNTER — Encounter: Payer: Self-pay | Admitting: Internal Medicine

## 2024-04-18 ENCOUNTER — Encounter
Admission: RE | Disposition: A | Discharge status: Home / Self Care | Payer: Self-pay | Source: Home / Self Care | Attending: Internal Medicine

## 2024-04-18 ENCOUNTER — Ambulatory Visit
Admission: RE | Admit: 2024-04-18 | Discharge: 2024-04-18 | Disposition: A | Discharge status: Home / Self Care | Attending: Internal Medicine | Admitting: Internal Medicine

## 2024-04-18 ENCOUNTER — Other Ambulatory Visit: Payer: Self-pay

## 2024-04-18 DIAGNOSIS — I2584 Coronary atherosclerosis due to calcified coronary lesion: Secondary | ICD-10-CM | POA: Insufficient documentation

## 2024-04-18 DIAGNOSIS — Z7985 Long-term (current) use of injectable non-insulin antidiabetic drugs: Secondary | ICD-10-CM | POA: Insufficient documentation

## 2024-04-18 DIAGNOSIS — I2510 Atherosclerotic heart disease of native coronary artery without angina pectoris: Secondary | ICD-10-CM | POA: Insufficient documentation

## 2024-04-18 DIAGNOSIS — E1169 Type 2 diabetes mellitus with other specified complication: Secondary | ICD-10-CM | POA: Insufficient documentation

## 2024-04-18 DIAGNOSIS — R9430 Abnormal result of cardiovascular function study, unspecified: Secondary | ICD-10-CM | POA: Diagnosis present

## 2024-04-18 DIAGNOSIS — Z6839 Body mass index (BMI) 39.0-39.9, adult: Secondary | ICD-10-CM | POA: Insufficient documentation

## 2024-04-18 DIAGNOSIS — Z7982 Long term (current) use of aspirin: Secondary | ICD-10-CM | POA: Insufficient documentation

## 2024-04-18 DIAGNOSIS — Z87891 Personal history of nicotine dependence: Secondary | ICD-10-CM | POA: Diagnosis not present

## 2024-04-18 DIAGNOSIS — Z79899 Other long term (current) drug therapy: Secondary | ICD-10-CM | POA: Insufficient documentation

## 2024-04-18 DIAGNOSIS — Z8249 Family history of ischemic heart disease and other diseases of the circulatory system: Secondary | ICD-10-CM | POA: Diagnosis not present

## 2024-04-18 DIAGNOSIS — E785 Hyperlipidemia, unspecified: Secondary | ICD-10-CM | POA: Diagnosis not present

## 2024-04-18 DIAGNOSIS — I10 Essential (primary) hypertension: Secondary | ICD-10-CM | POA: Diagnosis not present

## 2024-04-18 HISTORY — PX: LEFT HEART CATH AND CORONARY ANGIOGRAPHY: CATH118249

## 2024-04-18 LAB — GLUCOSE, CAPILLARY: Glucose-Capillary: 99 mg/dL (ref 70–99)

## 2024-04-18 LAB — CARDIAC CATHETERIZATION: Cath EF Quantitative: 55 %

## 2024-04-18 SURGERY — LEFT HEART CATH AND CORONARY ANGIOGRAPHY
Anesthesia: Moderate Sedation | Laterality: Left

## 2024-04-18 MED ORDER — FENTANYL CITRATE (PF) 100 MCG/2ML IJ SOLN
INTRAMUSCULAR | Status: DC | PRN
  Administered 2024-04-18: 50 ug via INTRAVENOUS

## 2024-04-18 MED ORDER — SODIUM CHLORIDE 0.9% FLUSH: 3.0000 mL | INTRAVENOUS | Status: DC | PRN

## 2024-04-18 MED ORDER — HYDRALAZINE HCL 20 MG/ML IJ SOLN: 10.0000 mg | INTRAMUSCULAR | Status: DC | PRN

## 2024-04-18 MED ORDER — HEPARIN (PORCINE) IN NACL 1000-0.9 UT/500ML-% IV SOLN
INTRAVENOUS | Status: DC | PRN
  Administered 2024-04-18 (×2): 500 mL

## 2024-04-18 MED ORDER — SODIUM CHLORIDE 0.9% FLUSH: 3.0000 mL | Freq: Two times a day (BID) | INTRAVENOUS | Status: DC

## 2024-04-18 MED ORDER — VERAPAMIL HCL 2.5 MG/ML IV SOLN
INTRAVENOUS | Status: AC
  Filled 2024-04-18: qty 2

## 2024-04-18 MED ORDER — DIPHENHYDRAMINE HCL 50 MG/ML IJ SOLN
INTRAMUSCULAR | Status: AC
  Administered 2024-04-18: 50 mg via INTRAVENOUS
  Filled 2024-04-18: qty 1

## 2024-04-18 MED ORDER — HEPARIN SODIUM (PORCINE) 1000 UNIT/ML IJ SOLN
INTRAMUSCULAR | Status: DC | PRN
  Administered 2024-04-18: 4000 [IU] via INTRAVENOUS

## 2024-04-18 MED ORDER — DIPHENHYDRAMINE HCL 50 MG/ML IJ SOLN: 50.0000 mg | Freq: Once | INTRAMUSCULAR | Status: AC

## 2024-04-18 MED ORDER — MIDAZOLAM HCL (PF) 2 MG/2ML IJ SOLN
INTRAMUSCULAR | Status: DC | PRN
  Administered 2024-04-18: 1 mg via INTRAVENOUS

## 2024-04-18 MED ORDER — ONDANSETRON HCL 4 MG/2ML IJ SOLN: 4.0000 mg | Freq: Four times a day (QID) | INTRAMUSCULAR | Status: DC | PRN

## 2024-04-18 MED ORDER — FREE WATER: 500.0000 mL | Freq: Once | Status: DC

## 2024-04-18 MED ORDER — FENTANYL CITRATE (PF) 100 MCG/2ML IJ SOLN
INTRAMUSCULAR | Status: AC
  Filled 2024-04-18: qty 2

## 2024-04-18 MED ORDER — MIDAZOLAM HCL 2 MG/2ML IJ SOLN
INTRAMUSCULAR | Status: AC
  Filled 2024-04-18: qty 2

## 2024-04-18 MED ORDER — HEPARIN SODIUM (PORCINE) 1000 UNIT/ML IJ SOLN
INTRAMUSCULAR | Status: AC
Start: 2024-04-18 — End: 2024-04-18
  Filled 2024-04-18: qty 10

## 2024-04-18 MED ORDER — ACETAMINOPHEN 325 MG PO TABS: 650.0000 mg | ORAL_TABLET | ORAL | Status: DC | PRN

## 2024-04-18 MED ORDER — FREE WATER
500.0000 mL | Freq: Once | Status: AC
  Administered 2024-04-18: 500 mL via ORAL

## 2024-04-18 MED ORDER — ASPIRIN 81 MG PO CHEW
CHEWABLE_TABLET | ORAL | Status: AC
  Filled 2024-04-18: qty 1

## 2024-04-18 MED ORDER — METHYLPREDNISOLONE SODIUM SUCC 125 MG IJ SOLR
INTRAMUSCULAR | Status: AC
  Administered 2024-04-18: 125 mg via INTRAVENOUS
  Filled 2024-04-18: qty 2

## 2024-04-18 MED ORDER — VERAPAMIL HCL 2.5 MG/ML IV SOLN
INTRAVENOUS | Status: DC | PRN
Start: 2024-04-18 — End: 2024-04-18
  Administered 2024-04-18: 2.5 mg via INTRA_ARTERIAL

## 2024-04-18 MED ORDER — ASPIRIN 81 MG PO CHEW
81.0000 mg | CHEWABLE_TABLET | ORAL | Status: AC
  Administered 2024-04-18: 81 mg via ORAL

## 2024-04-18 MED ORDER — FAMOTIDINE IN NACL 20-0.9 MG/50ML-% IV SOLN
20.0000 mg | Freq: Once | INTRAVENOUS | Status: AC
  Administered 2024-04-18: 20 mg via INTRAVENOUS
  Filled 2024-04-18: qty 50

## 2024-04-18 MED ORDER — SODIUM CHLORIDE 0.9 % IV SOLN: 250.0000 mL | INTRAVENOUS | Status: DC | PRN

## 2024-04-18 MED ORDER — LIDOCAINE HCL (PF) 1 % IJ SOLN
INTRAMUSCULAR | Status: DC | PRN
  Administered 2024-04-18: 5 mL

## 2024-04-18 MED ORDER — HEPARIN (PORCINE) IN NACL 1000-0.9 UT/500ML-% IV SOLN
INTRAVENOUS | Status: AC
  Filled 2024-04-18: qty 1000

## 2024-04-18 MED ORDER — IOHEXOL 300 MG/ML  SOLN
INTRAMUSCULAR | Status: DC | PRN
  Administered 2024-04-18: 105 mL

## 2024-04-18 MED ORDER — METHYLPREDNISOLONE SODIUM SUCC 125 MG IJ SOLR: 125.0000 mg | Freq: Once | INTRAMUSCULAR | Status: AC

## 2024-04-18 MED ORDER — LIDOCAINE HCL 1 % IJ SOLN
INTRAMUSCULAR | Status: AC
  Filled 2024-04-18: qty 20

## 2024-04-18 SURGICAL SUPPLY — 8 items
CATH 5FR JL3.5 JR4 ANG PIG MP (CATHETERS) IMPLANT
DEVICE RAD TR BAND REGULAR (VASCULAR PRODUCTS) IMPLANT
DRAPE BRACHIAL (DRAPES) IMPLANT
GLIDESHEATH SLEND SS 6F .021 (SHEATH) IMPLANT
GUIDEWIRE INQWIRE 1.5J.035X260 (WIRE) IMPLANT
PACK CARDIAC CATH (CUSTOM PROCEDURE TRAY) ×2 IMPLANT
SET ATX-X65L (MISCELLANEOUS) IMPLANT
STATION PROTECTION PRESSURIZED (MISCELLANEOUS) IMPLANT

## 2024-04-18 NOTE — Discharge Instructions (Signed)
 Radial Site Care Refer to this sheet in the next few weeks. These instructions provide you with information about caring for yourself after your procedure. Your health care provider may also give you more specific instructions. Your treatment has been planned according to current medical practices, but problems sometimes occur. Call your health care provider if you have any problems or questions after your procedure. What can I expect after the procedure? After your procedure, it is typical to have the following: Bruising at the radial site that usually fades within 1-2 weeks. Blood collecting in the tissue (hematoma) that may be painful to the touch. It should usually decrease in size and tenderness within 1-2 weeks.  Follow these instructions at home: Take medicines only as directed by your health care provider. If you are on a medication called Metformin please do not take for 48 hours after your procedure. Over the next 48hrs please increase your fluid intake of water and non caffeine beverages to flush the contrast dye out of your system.  You may shower 24 hours after the procedure  Leave your bandage on and gently wash the site with plain soap and water. Pat the area dry with a clean towel. Do not rub the site, because this may cause bleeding.  Remove your dressing 48hrs after your procedure and leave open to air.  Do not submerge your site in water for 7 days. This includes swimming and washing dishes.  Check your insertion site every day for redness, swelling, or drainage. Do not apply powder or lotion to the site. Do not flex or bend the affected arm for 24 hours or as directed by your health care provider. Do not push or pull heavy objects with the affected arm for 24 hours or as directed by your health care provider. Do not lift over 10 lb (4.5 kg) for 5 days after your procedure or as directed by your health care provider. Ask your health care provider when it is okay to: Return to  work or school. Resume usual physical activities or sports. Resume sexual activity. Do not drive home if you are discharged the same day as the procedure. Have someone else drive you. You may drive 48 hours after the procedure Do not operate machinery or power tools for 24 hours after the procedure. If your procedure was done as an outpatient procedure, which means that you went home the same day as your procedure, a responsible adult should be with you for the first 24 hours after you arrive home. Keep all follow-up visits as directed by your health care provider. This is important. Contact a health care provider if: You have a fever. You have chills. You have increased bleeding from the radial site. Hold pressure on the site. Get help right away if: You have unusual pain at the radial site. You have redness, warmth, or swelling at the radial site. You have drainage (other than a small amount of blood on the dressing) from the radial site. The radial site is bleeding, and the bleeding does not stop after 15 minutes of holding steady pressure on the site. Your arm or hand becomes pale, cool, tingly, or numb. This information is not intended to replace advice given to you by your health care provider. Make sure you discuss any questions you have with your health care provider. Document Released: 07/19/2010 Document Revised: 11/22/2015 Document Reviewed: 01/02/2014 Elsevier Interactive Patient Education  2018 [employer].

## 2024-04-19 ENCOUNTER — Encounter: Payer: Self-pay | Admitting: Internal Medicine

## 2024-06-20 ENCOUNTER — Encounter: Payer: Self-pay | Admitting: Gastroenterology

## 2024-06-20 ENCOUNTER — Ambulatory Visit
Admission: RE | Admit: 2024-06-20 | Discharge: 2024-06-20 | Disposition: A | Discharge status: Home / Self Care | Attending: Gastroenterology | Admitting: Gastroenterology

## 2024-06-20 ENCOUNTER — Ambulatory Visit

## 2024-06-20 ENCOUNTER — Encounter: Admission: RE | Disposition: A | Payer: Self-pay | Attending: Gastroenterology

## 2024-06-20 ENCOUNTER — Other Ambulatory Visit: Payer: Self-pay

## 2024-06-20 DIAGNOSIS — Z7985 Long-term (current) use of injectable non-insulin antidiabetic drugs: Secondary | ICD-10-CM | POA: Insufficient documentation

## 2024-06-20 DIAGNOSIS — D759 Disease of blood and blood-forming organs, unspecified: Secondary | ICD-10-CM | POA: Diagnosis not present

## 2024-06-20 DIAGNOSIS — K219 Gastro-esophageal reflux disease without esophagitis: Secondary | ICD-10-CM | POA: Diagnosis not present

## 2024-06-20 DIAGNOSIS — Z6838 Body mass index (BMI) 38.0-38.9, adult: Secondary | ICD-10-CM | POA: Diagnosis not present

## 2024-06-20 DIAGNOSIS — E66813 Obesity, class 3: Secondary | ICD-10-CM | POA: Insufficient documentation

## 2024-06-20 DIAGNOSIS — G4730 Sleep apnea, unspecified: Secondary | ICD-10-CM | POA: Insufficient documentation

## 2024-06-20 DIAGNOSIS — Z7984 Long term (current) use of oral hypoglycemic drugs: Secondary | ICD-10-CM | POA: Diagnosis not present

## 2024-06-20 DIAGNOSIS — D122 Benign neoplasm of ascending colon: Secondary | ICD-10-CM | POA: Diagnosis not present

## 2024-06-20 DIAGNOSIS — Z1211 Encounter for screening for malignant neoplasm of colon: Secondary | ICD-10-CM | POA: Diagnosis present

## 2024-06-20 DIAGNOSIS — K635 Polyp of colon: Secondary | ICD-10-CM | POA: Diagnosis not present

## 2024-06-20 DIAGNOSIS — I10 Essential (primary) hypertension: Secondary | ICD-10-CM | POA: Insufficient documentation

## 2024-06-20 DIAGNOSIS — E119 Type 2 diabetes mellitus without complications: Secondary | ICD-10-CM | POA: Insufficient documentation

## 2024-06-20 DIAGNOSIS — D649 Anemia, unspecified: Secondary | ICD-10-CM | POA: Diagnosis not present

## 2024-06-20 DIAGNOSIS — F17290 Nicotine dependence, other tobacco product, uncomplicated: Secondary | ICD-10-CM | POA: Diagnosis not present

## 2024-06-20 HISTORY — PX: POLYPECTOMY: SHX149

## 2024-06-20 HISTORY — PX: COLONOSCOPY: SHX5424

## 2024-06-20 LAB — GLUCOSE, CAPILLARY: Glucose-Capillary: 97 mg/dL (ref 70–99)

## 2024-06-20 SURGERY — COLONOSCOPY
Anesthesia: General

## 2024-06-20 MED ORDER — PROPOFOL 1000 MG/100ML IV EMUL
INTRAVENOUS | Status: AC
  Filled 2024-06-20: qty 100

## 2024-06-20 MED ORDER — LIDOCAINE HCL (CARDIAC) PF 100 MG/5ML IV SOSY
PREFILLED_SYRINGE | INTRAVENOUS | Status: DC | PRN
  Administered 2024-06-20: 50 mg via INTRAVENOUS

## 2024-06-20 MED ORDER — PROPOFOL 500 MG/50ML IV EMUL
INTRAVENOUS | Status: DC | PRN
  Administered 2024-06-20: 150 ug/kg/min via INTRAVENOUS

## 2024-06-20 MED ORDER — SODIUM CHLORIDE 0.9 % IV SOLN: INTRAVENOUS | Status: DC

## 2024-06-20 MED ORDER — LIDOCAINE HCL (PF) 2 % IJ SOLN
INTRAMUSCULAR | Status: AC
  Filled 2024-06-20: qty 5

## 2024-06-20 MED ORDER — PROPOFOL 10 MG/ML IV BOLUS
INTRAVENOUS | Status: DC | PRN
  Administered 2024-06-20: 100 mg via INTRAVENOUS

## 2024-06-20 NOTE — H&P (Signed)
 "  Ruel Kung , MD [address], [place], [place], [address] Phone: [phone] Fax: [phone]  Primary Care Physician:  Alla Amis, MD   Pre-Procedure History & Physical: HPI:  Connor Brewer is a 63 y.o. male is here for an colonoscopy.   Past Medical History:  Diagnosis Date   Bronchitis     aalergy driven   Diabetes mellitus without complication (HCC)    DVT (deep venous thrombosis) (HCC)    LLE   GERD (gastroesophageal reflux disease)    H/O seasonal allergies    Hypertension    Obesity    Osteoarthritis    right hip   PONV (postoperative nausea and vomiting)    Pre-diabetes    Sleep apnea    wears CPAP    Past Surgical History:  Procedure Laterality Date   BACK SURGERY  2014   laser spine   COLONOSCOPY W/ BIOPSIES AND POLYPECTOMY     COLONOSCOPY WITH PROPOFOL  N/A 01/04/2019   Procedure: COLONOSCOPY WITH PROPOFOL ;  Surgeon: Jinny Carmine, MD;  Location: [facility] ENDOSCOPY;  Service: Endoscopy;  Laterality: N/A;   COLONOSCOPY WITH PROPOFOL  N/A 01/08/2019   Procedure: COLONOSCOPY WITH PROPOFOL ;  Surgeon: Kung Ruel, MD;  Location: [facility] ENDOSCOPY;  Service: Gastroenterology;  Laterality: N/A;   COLONOSCOPY WITH PROPOFOL  N/A 05/05/2019   Procedure: COLONOSCOPY WITH PROPOFOL ;  Surgeon: Kung Ruel, MD;  Location: [facility] ENDOSCOPY;  Service: Gastroenterology;  Laterality: N/A;   ENTEROSCOPY  01/08/2019   Procedure: ENTEROSCOPY;  Surgeon: Kung Ruel, MD;  Location: [facility] ENDOSCOPY;  Service: Gastroenterology;;   ESOPHAGOGASTRODUODENOSCOPY (EGD) WITH PROPOFOL  N/A 01/03/2019   Procedure: ESOPHAGOGASTRODUODENOSCOPY (EGD) WITH PROPOFOL ;  Surgeon: Jinny Carmine, MD;  Location: [facility] ENDOSCOPY;  Service: Endoscopy;  Laterality: N/A;   FRACTURE SURGERY  [phone]   right arm, left knee, right knee (rod in right arm, screws in right knee, one screw in left knee)   GIVENS CAPSULE STUDY N/A 01/06/2019   Procedure: GIVENS CAPSULE STUDY;  Surgeon: Kung Ruel, MD;   Location: [facility] ENDOSCOPY;  Service: Gastroenterology;  Laterality: N/A;   KNEE ARTHROPLASTY Left 02/19/2018   Procedure: COMPUTER ASSISTED TOTAL KNEE ARTHROPLASTY;  Surgeon: Mardee Lynwood SQUIBB, MD;  Location: [facility] ORS;  Service: Orthopedics;  Laterality: Left;   LEFT HEART CATH AND CORONARY ANGIOGRAPHY Left 04/18/2024   Procedure: LEFT HEART CATH AND CORONARY ANGIOGRAPHY;  Surgeon: Florencio Cara BIRCH, MD;  Location: [facility] INVASIVE CV LAB;  Service: Cardiovascular;  Laterality: Left;   MULTIPLE TOOTH EXTRACTIONS     TOTAL HIP ARTHROPLASTY Right 06/09/2016   Procedure: TOTAL HIP ARTHROPLASTY ANTERIOR APPROACH;  Surgeon: Dempsey Sensor, MD;  Location: [facility] OR;  Service: Orthopedics;  Laterality: Right;    Prior to Admission medications  Medication Sig Start Date End Date Taking? Authorizing Provider  amLODipine  (NORVASC ) 10 MG tablet Take 10 mg by mouth daily.    Yes [provider]  aspirin  EC 81 MG tablet Take 81 mg by mouth daily. Swallow whole.   Yes [provider]  Coenzyme Q10 (CO Q-10) 100 MG CAPS Take 100 mg by mouth daily.   Yes [provider]  escitalopram (LEXAPRO) 5 MG tablet Take 5 mg by mouth daily. 03/19/23  Yes [provider]  fenofibrate 160 MG tablet Take 160 mg by mouth daily.   Yes [provider]  hydrALAZINE  (APRESOLINE ) 25 MG tablet Take 25 mg by mouth 2 (two) times daily. 03/18/24  Yes [provider]  hydrochlorothiazide  (HYDRODIURIL ) 25 MG tablet Take 25 mg  by mouth daily. 10/17/22  Yes [provider]  losartan  (COZAAR ) 100 MG tablet Take 1 tablet by mouth daily. 08/11/22  Yes [provider]  Multiple Vitamins-Minerals (CENTRUM SILVER 50+MEN PO) Take 1 tablet by mouth daily.   Yes [provider]  pantoprazole  (PROTONIX ) 40 MG tablet Take 1 tablet (40 mg total) by mouth 2 (two) times daily. Patient taking differently: Take 40 mg by mouth daily. 01/09/19  Yes Gouru, Aruna, MD  rosuvastatin (CRESTOR) 20  MG tablet Take 20 mg by mouth at bedtime. 03/16/24  Yes [provider]  albuterol  (VENTOLIN  HFA) 108 (90 Base) MCG/ACT inhaler Inhale 2 puffs into the lungs every 6 (six) hours as needed for wheezing or shortness of breath. 05/05/16   [provider]  baclofen (LIORESAL) 10 MG tablet Take 10 mg by mouth daily as needed for muscle spasms. 08/05/23   [provider]  ferrous sulfate  325 (65 FE) MG EC tablet Take 1 tablet (325 mg total) by mouth 2 (two) times daily. 01/09/19 01/09/20  Gouru, Aruna, MD  fluticasone (FLONASE) 50 MCG/ACT nasal spray Place 2 sprays into both nostrils daily as needed for allergies. 08/28/22 04/14/24  [provider]  glucose blood (ACCU-CHEK GUIDE TEST) test strip 1 each (1 strip total) 2 (two) times daily Use as instructed. 10/11/21   [provider]  MOUNJARO 15 MG/0.5ML Pen Inject 15 mg into the skin once a week. 08/30/23   [provider]  NON FORMULARY Pt uses a c-pap nightly    [provider]    Allergies as of 06/07/2024 - Review Complete 04/18/2024  Allergen Reaction Noted   Iodinated contrast media Anaphylaxis, Other (See Comments), and Swelling 05/26/2016   Metrizamide Anaphylaxis, Swelling, and Other (See Comments) 05/26/2016    Family History  Problem Relation Age of Onset   Lung cancer Father    Cancer Brother     Social History   Socioeconomic History   Marital status: Married    Spouse name: [name]   Number of children: 2   Years of education: Not on file   Highest education level: Not on file  Occupational History   Not on file  Tobacco Use   Smoking status: Some Days    Types: Cigars   Smokeless tobacco: Never  Vaping Use   Vaping status: Never Used  Substance and Sexual Activity   Alcohol use: Yes    Comment: social   Drug use: No   Sexual activity: Not on file  Other Topics Concern   Not on file  Social History Narrative   Not on file   Social Drivers of Health   Tobacco  Use: High Risk (06/20/2024)   Patient History    Smoking Tobacco Use: Some Days    Smokeless Tobacco Use: Never    Passive Exposure: Not on file  Financial Resource Strain: Low Risk  (03/17/2024)   Received from [facility]   Overall Financial Resource Strain (CARDIA)    Difficulty of Paying Living Expenses: Not very hard  Food Insecurity: No Food Insecurity (03/17/2024)   Received from [facility]   Epic    Within the past 12 months, you worried that your food would run out before you got the money to buy more.: Never true    Within the past 12 months, the food you bought just didn't last and you didn't have money to get more.: Never true  Transportation Needs: No Transportation Needs (03/17/2024)  Received from [facility] - Transportation    In the past 12 months, has lack of transportation kept you from medical appointments or from getting medications?: No    Lack of Transportation (Non-Medical): No  Physical Activity: Not on file  Stress: Not on file  Social Connections: Not on file  Intimate Partner Violence: Not on file  Depression (EYV7-0): Not on file  Alcohol Screen: Not on file  Housing: Low Risk  (03/17/2024)   Received from [facility]   Epic    In the last 12 months, was there a time when you were not able to pay the mortgage or rent on time?: No    In the past 12 months, how many times have you moved where you were living?: 0    At any time in the past 12 months, were you homeless or living in a shelter (including now)?: No  Utilities: Not At Risk (08/05/2023)   Received from [facility] Utilities    Threatened with loss of utilities: No  Health Literacy: Not on file    Review of Systems: See HPI, otherwise negative ROS  Physical Exam: BP 125/74   Pulse (!) 53   Temp (!) 96.4 F (35.8 C) (Temporal)   Resp 16   Ht 5' 10 (1.778 m)   Wt 121.1 kg   SpO2 97%    BMI 38.31 kg/m  General:   Alert,  pleasant and cooperative in NAD Head:  Normocephalic and atraumatic. Neck:  Supple; no masses or thyromegaly. Lungs:  Clear throughout to auscultation, normal respiratory effort.    Heart:  +S1, +S2, Regular rate and rhythm, No edema. Abdomen:  Soft, nontender and nondistended. Normal bowel sounds, without guarding, and without rebound.   Neurologic:  Alert and  oriented x4;  grossly normal neurologically.  Impression/Plan: Connor Brewer is here for an colonoscopy to be performed for surveillance due to prior history of colon polyps   Risks, benefits, limitations, and alternatives regarding  colonoscopy have been reviewed with the patient.  Questions have been answered.  All parties agreeable.   Ruel Kung, MD  06/20/2024, 7:46 AM  "

## 2024-06-20 NOTE — Transfer of Care (Signed)
 Immediate Anesthesia Transfer of Care Note  Patient: Connor Brewer  Procedure(s) Performed: COLONOSCOPY POLYPECTOMY, INTESTINE  Patient Location: PACU and Endoscopy Unit  Anesthesia Type:MAC  Level of Consciousness: sedated, patient cooperative, and responds to stimulation  Airway & Oxygen Therapy: Patient Spontanous Breathing and Patient connected to nasal cannula oxygen  Post-op Assessment: Report given to RN, Post -op Vital signs reviewed and stable, and Patient moving all extremities  Post vital signs: Reviewed and stable  Last Vitals:  Vitals Value Taken Time  BP 101/60 06/20/24 08:13  Temp 35.7 C 06/20/24 08:12  Pulse 49 06/20/24 08:13  Resp 16 06/20/24 08:13  SpO2 100 % 06/20/24 08:13  Vitals shown include unfiled device data.  Last Pain:  Vitals:   06/20/24 0812  TempSrc: Temporal  PainSc: 0-No pain         Complications: No notable events documented.

## 2024-06-20 NOTE — Anesthesia Postprocedure Evaluation (Signed)
"   Anesthesia Post Note  Patient: Connor Brewer  Procedure(s) Performed: COLONOSCOPY POLYPECTOMY, INTESTINE  Patient location during evaluation: PACU Anesthesia Type: General Level of consciousness: awake Pain management: pain level controlled Respiratory status: spontaneous breathing Cardiovascular status: blood pressure returned to baseline Anesthetic complications: no   No notable events documented.   Last Vitals:  Vitals:   06/20/24 0822 06/20/24 0832  BP: 123/71 138/75  Pulse: (!) 47 (!) 48  Resp: 15 13  Temp:    SpO2: 100% 100%    Last Pain:  Vitals:   06/20/24 0832  TempSrc:   PainSc: 0-No pain                 VAN STAVEREN,Valdez Brannan      "

## 2024-06-20 NOTE — Op Note (Signed)
 [facility] Gastroenterology Patient Name: Connor Brewer Procedure Date: 06/20/2024 7:14 AM MRN: [id number] Account #: [id number] Date of Birth: 1961-01-20 Admit Type: Outpatient Age: 63 Room: [facility] ENDO ROOM 2 Gender: Male Note Status: Finalized Instrument Name: Colon Scope [id number] Procedure:             Colonoscopy Indications:           Surveillance: Personal history of adenomatous polyps                         on last colonoscopy > 3 years ago, Last colonoscopy:                         November 2020 Providers:             Ruel Kung MD, MD Referring MD:          Alda Carpen (Referring MD) Medicines:             Propofol  per Anesthesia, Monitored Anesthesia Care Complications:         No immediate complications. Procedure:             Pre-Anesthesia Assessment:                        - Prior to the procedure, a History and Physical was                         performed, and patient medications, allergies and                         sensitivities were reviewed. The patient's tolerance                         of previous anesthesia was reviewed.                        - The risks and benefits of the procedure and the                         sedation options and risks were discussed with the                         patient. All questions were answered and informed                         consent was obtained.                        - ASA Grade Assessment: II - A patient with mild                         systemic disease.                        After obtaining informed consent, the colonoscope was                         passed under direct vision. Throughout the procedure,  the patient's blood pressure, pulse, and oxygen                         saturations were monitored continuously. The                         Colonoscope was introduced through the anus and                         advanced to the the cecum, identified by the                          appendiceal orifice. The colonoscopy was performed                         with ease. The patient tolerated the procedure well.                         The quality of the bowel preparation was excellent.                         The ileocecal valve, appendiceal orifice, and rectum                         were photographed. Findings:      The perianal and digital rectal examinations were normal.      Two sessile polyps were found in the cecum. The polyps were 3 to 5 mm in       size. These polyps were removed with a cold snare. Resection and       retrieval were complete.      Two sessile polyps were found in the ascending colon. The polyps were 4       to 5 mm in size. These polyps were removed with a cold snare. Resection       and retrieval were complete.      A 2 mm polyp was found in the transverse colon. The polyp was sessile.       The polyp was removed with a jumbo cold forceps. Resection and retrieval       were complete.      The exam was otherwise without abnormality on direct and retroflexion       views. Impression:            - Two 3 to 5 mm polyps in the cecum, removed with a                         cold snare. Resected and retrieved.                        - Two 4 to 5 mm polyps in the ascending colon, removed                         with a cold snare. Resected and retrieved.                        - One 2 mm polyp in the transverse colon, removed with  a jumbo cold forceps. Resected and retrieved.                        - The examination was otherwise normal on direct and                         retroflexion views. Recommendation:        - Discharge patient to home (with escort).                        - Resume previous diet.                        - Continue present medications.                        - Await pathology results.                        - Repeat colonoscopy for surveillance based on                         pathology  results. Procedure Code(s):     --- Professional ---                        [phone], Colonoscopy, flexible; with removal of                         tumor(s), polyp(s), or other lesion(s) by snare                         technique                        45380, 59, Colonoscopy, flexible; with biopsy, single                         or multiple Diagnosis Code(s):     --- Professional ---                        Z86.010, Personal history of colonic polyps                        D12.0, Benign neoplasm of cecum                        D12.2, Benign neoplasm of ascending colon                        D12.3, Benign neoplasm of transverse colon (hepatic                         flexure or splenic flexure) CPT copyright 2022 [employer]. All rights reserved. The codes documented in this report are preliminary and upon coder review may  be revised to meet current compliance requirements. Ruel Kung, MD Ruel Kung MD, MD 06/20/2024 8:10:29 AM This report has been signed electronically. Number of Addenda: 0 Note Initiated On: 06/20/2024 7:14 AM Scope Withdrawal Time: 0 hours 13 minutes 44 seconds  Total Procedure Duration: 0 hours 16 minutes 16 seconds  Estimated Blood Loss:  Estimated blood loss: none.      [facility]

## 2024-06-20 NOTE — Anesthesia Preprocedure Evaluation (Addendum)
 "                                  Anesthesia Evaluation  Patient identified by MRN, date of birth, ID band Patient awake    Reviewed: Allergy & Precautions, NPO status , Patient's Chart, lab work & pertinent test results  History of Anesthesia Complications (+) PONV and history of anesthetic complications  Airway Mallampati: III  TM Distance: >3 FB Neck ROM: full    Dental  (+) Teeth Intact   Pulmonary sleep apnea and Continuous Positive Airway Pressure Ventilation , Current Smoker and Patient abstained from smoking.   Pulmonary exam normal breath sounds clear to auscultation       Cardiovascular Exercise Tolerance: Good hypertension, Pt. on medications Normal cardiovascular exam Rhythm:Regular Rate:Normal     Neuro/Psych negative neurological ROS  negative psych ROS   GI/Hepatic negative GI ROS, Neg liver ROS,GERD  ,,  Endo/Other  diabetes, Type 2, Oral Hypoglycemic Agents  Class 3 obesity  Renal/GU negative Renal ROS  negative genitourinary   Musculoskeletal   Abdominal  (+) + obese  Peds negative pediatric ROS (+)  Hematology negative hematology ROS (+) Blood dyscrasia, anemia   Anesthesia Other Findings Past Medical History: No date: Bronchitis     Comment:   aalergy driven No date: Diabetes mellitus without complication (HCC) No date: DVT (deep venous thrombosis) (HCC)     Comment:  LLE No date: GERD (gastroesophageal reflux disease) No date: H/O seasonal allergies No date: Hypertension No date: Obesity No date: Osteoarthritis     Comment:  right hip No date: PONV (postoperative nausea and vomiting) No date: Pre-diabetes No date: Sleep apnea     Comment:  wears CPAP  Past Surgical History: 2014: BACK SURGERY     Comment:  laser spine No date: COLONOSCOPY W/ BIOPSIES AND POLYPECTOMY 01/04/2019: COLONOSCOPY WITH PROPOFOL ; N/A     Comment:  Procedure: COLONOSCOPY WITH PROPOFOL ;  Surgeon: Jinny Carmine, MD;  Location:  [facility] ENDOSCOPY;  Service:               Endoscopy;  Laterality: N/A; 01/08/2019: COLONOSCOPY WITH PROPOFOL ; N/A     Comment:  Procedure: COLONOSCOPY WITH PROPOFOL ;  Surgeon: Therisa Bi, MD;  Location: [facility] ENDOSCOPY;  Service:               Gastroenterology;  Laterality: N/A; 05/05/2019: COLONOSCOPY WITH PROPOFOL ; N/A     Comment:  Procedure: COLONOSCOPY WITH PROPOFOL ;  Surgeon: Therisa Bi, MD;  Location: [facility] ENDOSCOPY;  Service:               Gastroenterology;  Laterality: N/A; 01/08/2019: ENTEROSCOPY     Comment:  Procedure: ENTEROSCOPY;  Surgeon: Therisa Bi, MD;                Location: [facility] ENDOSCOPY;  Service: Gastroenterology;; 01/03/2019: ESOPHAGOGASTRODUODENOSCOPY (EGD) WITH PROPOFOL ; N/A     Comment:  Procedure: ESOPHAGOGASTRODUODENOSCOPY (EGD) WITH               PROPOFOL ;  Surgeon: Jinny Carmine, MD;  Location: [facility]               ENDOSCOPY;  Service: Endoscopy;  Laterality: N/A; [id number]:  FRACTURE SURGERY     Comment:  right arm, left knee, right knee (rod in right arm,               screws in right knee, one screw in left knee) 01/06/2019: GIVENS CAPSULE STUDY; N/A     Comment:  Procedure: GIVENS CAPSULE STUDY;  Surgeon: Therisa Bi,               MD;  Location: [facility] ENDOSCOPY;  Service:               Gastroenterology;  Laterality: N/A; 02/19/2018: KNEE ARTHROPLASTY; Left     Comment:  Procedure: COMPUTER ASSISTED TOTAL KNEE ARTHROPLASTY;                Surgeon: Mardee Lynwood SQUIBB, MD;  Location: [facility] ORS;                Service: Orthopedics;  Laterality: Left; 04/18/2024: LEFT HEART CATH AND CORONARY ANGIOGRAPHY; Left     Comment:  Procedure: LEFT HEART CATH AND CORONARY ANGIOGRAPHY;                Surgeon: Florencio Cara BIRCH, MD;  Location: [facility] INVASIVE              CV LAB;  Service: Cardiovascular;  Laterality: Left; No date: MULTIPLE TOOTH EXTRACTIONS 06/09/2016: TOTAL HIP ARTHROPLASTY; Right     Comment:  Procedure: TOTAL HIP ARTHROPLASTY  ANTERIOR APPROACH;                Surgeon: Dempsey Sensor, MD;  Location: [facility] OR;  Service:               Orthopedics;  Laterality: Right;  BMI    Body Mass Index: 38.31 kg/m      Reproductive/Obstetrics negative OB ROS                              Anesthesia Physical Anesthesia Plan  ASA: 3  Anesthesia Plan: General   Post-op Pain Management:    Induction: Intravenous  PONV Risk Score and Plan: Propofol  infusion and TIVA  Airway Management Planned: Natural Airway and Nasal Cannula  Additional Equipment:   Intra-op Plan:   Post-operative Plan:   Informed Consent: I have reviewed the patients History and Physical, chart, labs and discussed the procedure including the risks, benefits and alternatives for the proposed anesthesia with the patient or authorized representative who has indicated his/her understanding and acceptance.     Dental Advisory Given  Plan Discussed with: Anesthesiologist, CRNA and Surgeon  Anesthesia Plan Comments:          Anesthesia Quick Evaluation  "

## 2024-06-22 LAB — SURGICAL PATHOLOGY

## 2024-06-26 ENCOUNTER — Ambulatory Visit: Payer: Self-pay | Admitting: Gastroenterology
